# Patient Record
Sex: Male | Born: 1975 | Race: White | Hispanic: No | Marital: Single | Smoking: Former smoker
Health system: Southern US, Community
[De-identification: ages and names within clinical notes are randomized; demographics above are authoritative.]

## PROBLEM LIST (undated history)

## (undated) DIAGNOSIS — K219 Gastro-esophageal reflux disease without esophagitis: Secondary | ICD-10-CM

## (undated) DIAGNOSIS — F1090 Alcohol use, unspecified, uncomplicated: Secondary | ICD-10-CM

## (undated) DIAGNOSIS — Z7289 Other problems related to lifestyle: Secondary | ICD-10-CM

## (undated) DIAGNOSIS — B019 Varicella without complication: Secondary | ICD-10-CM

## (undated) DIAGNOSIS — F419 Anxiety disorder, unspecified: Secondary | ICD-10-CM

## (undated) DIAGNOSIS — Z789 Other specified health status: Secondary | ICD-10-CM

## (undated) DIAGNOSIS — E785 Hyperlipidemia, unspecified: Secondary | ICD-10-CM

## (undated) DIAGNOSIS — I10 Essential (primary) hypertension: Secondary | ICD-10-CM

## (undated) DIAGNOSIS — R011 Cardiac murmur, unspecified: Secondary | ICD-10-CM

## (undated) DIAGNOSIS — K5900 Constipation, unspecified: Secondary | ICD-10-CM

## (undated) HISTORY — DX: Other specified health status: Z78.9

## (undated) HISTORY — DX: Essential (primary) hypertension: I10

## (undated) HISTORY — DX: Constipation, unspecified: K59.00

## (undated) HISTORY — DX: Varicella without complication: B01.9

## (undated) HISTORY — DX: Alcohol use, unspecified, uncomplicated: F10.90

## (undated) HISTORY — DX: Hyperlipidemia, unspecified: E78.5

## (undated) HISTORY — DX: Other problems related to lifestyle: Z72.89

## (undated) HISTORY — PX: KNEE ARTHROCENTESIS: SUR44

## (undated) HISTORY — DX: Cardiac murmur, unspecified: R01.1

## (undated) HISTORY — DX: Anxiety disorder, unspecified: F41.9

## (undated) HISTORY — DX: Gastro-esophageal reflux disease without esophagitis: K21.9

---

## 2004-06-02 ENCOUNTER — Emergency Department (HOSPITAL_COMMUNITY): Admission: EM | Admit: 2004-06-02 | Discharge: 2004-06-02 | Payer: Self-pay | Admitting: Emergency Medicine

## 2004-07-08 ENCOUNTER — Observation Stay (HOSPITAL_COMMUNITY): Admission: AC | Admit: 2004-07-08 | Discharge: 2004-07-08 | Payer: Self-pay

## 2005-03-10 ENCOUNTER — Emergency Department (HOSPITAL_COMMUNITY): Admission: EM | Admit: 2005-03-10 | Discharge: 2005-03-10 | Payer: Self-pay | Admitting: Family Medicine

## 2005-03-12 ENCOUNTER — Encounter (INDEPENDENT_AMBULATORY_CARE_PROVIDER_SITE_OTHER): Payer: Self-pay | Admitting: Specialist

## 2005-03-12 ENCOUNTER — Ambulatory Visit (HOSPITAL_COMMUNITY): Admission: RE | Admit: 2005-03-12 | Discharge: 2005-03-12 | Payer: Self-pay | Admitting: *Deleted

## 2005-03-31 ENCOUNTER — Encounter: Admission: RE | Admit: 2005-03-31 | Discharge: 2005-03-31 | Payer: Self-pay | Admitting: *Deleted

## 2005-09-01 ENCOUNTER — Emergency Department (HOSPITAL_COMMUNITY): Admission: EM | Admit: 2005-09-01 | Discharge: 2005-09-01 | Payer: Self-pay | Admitting: Family Medicine

## 2005-12-03 IMAGING — CR DG SMALL BOWEL
1 series · 1 of 1 positions shown · non-contrast
Comparison: none

CLINICAL DATA: Diarrhea.  Some inflammation seen on colonoscopic exam. 
 SMALL BOWEL SERIES:
 The scout film shows no evidence of obstruction, mass or abnormal calcification.  The patient ingested barium and follow-up studies were made at approximately 20 minute intervals up to 1? hours and showed the small bowel to be normal in position, transit time and mucosal pattern.  Spot films of the terminal ileum show no evidence of thickening, mass or fistula formation.  The mucosa appeared normal as far as could be identified.   The appendix was not filled.

[view not recorded]
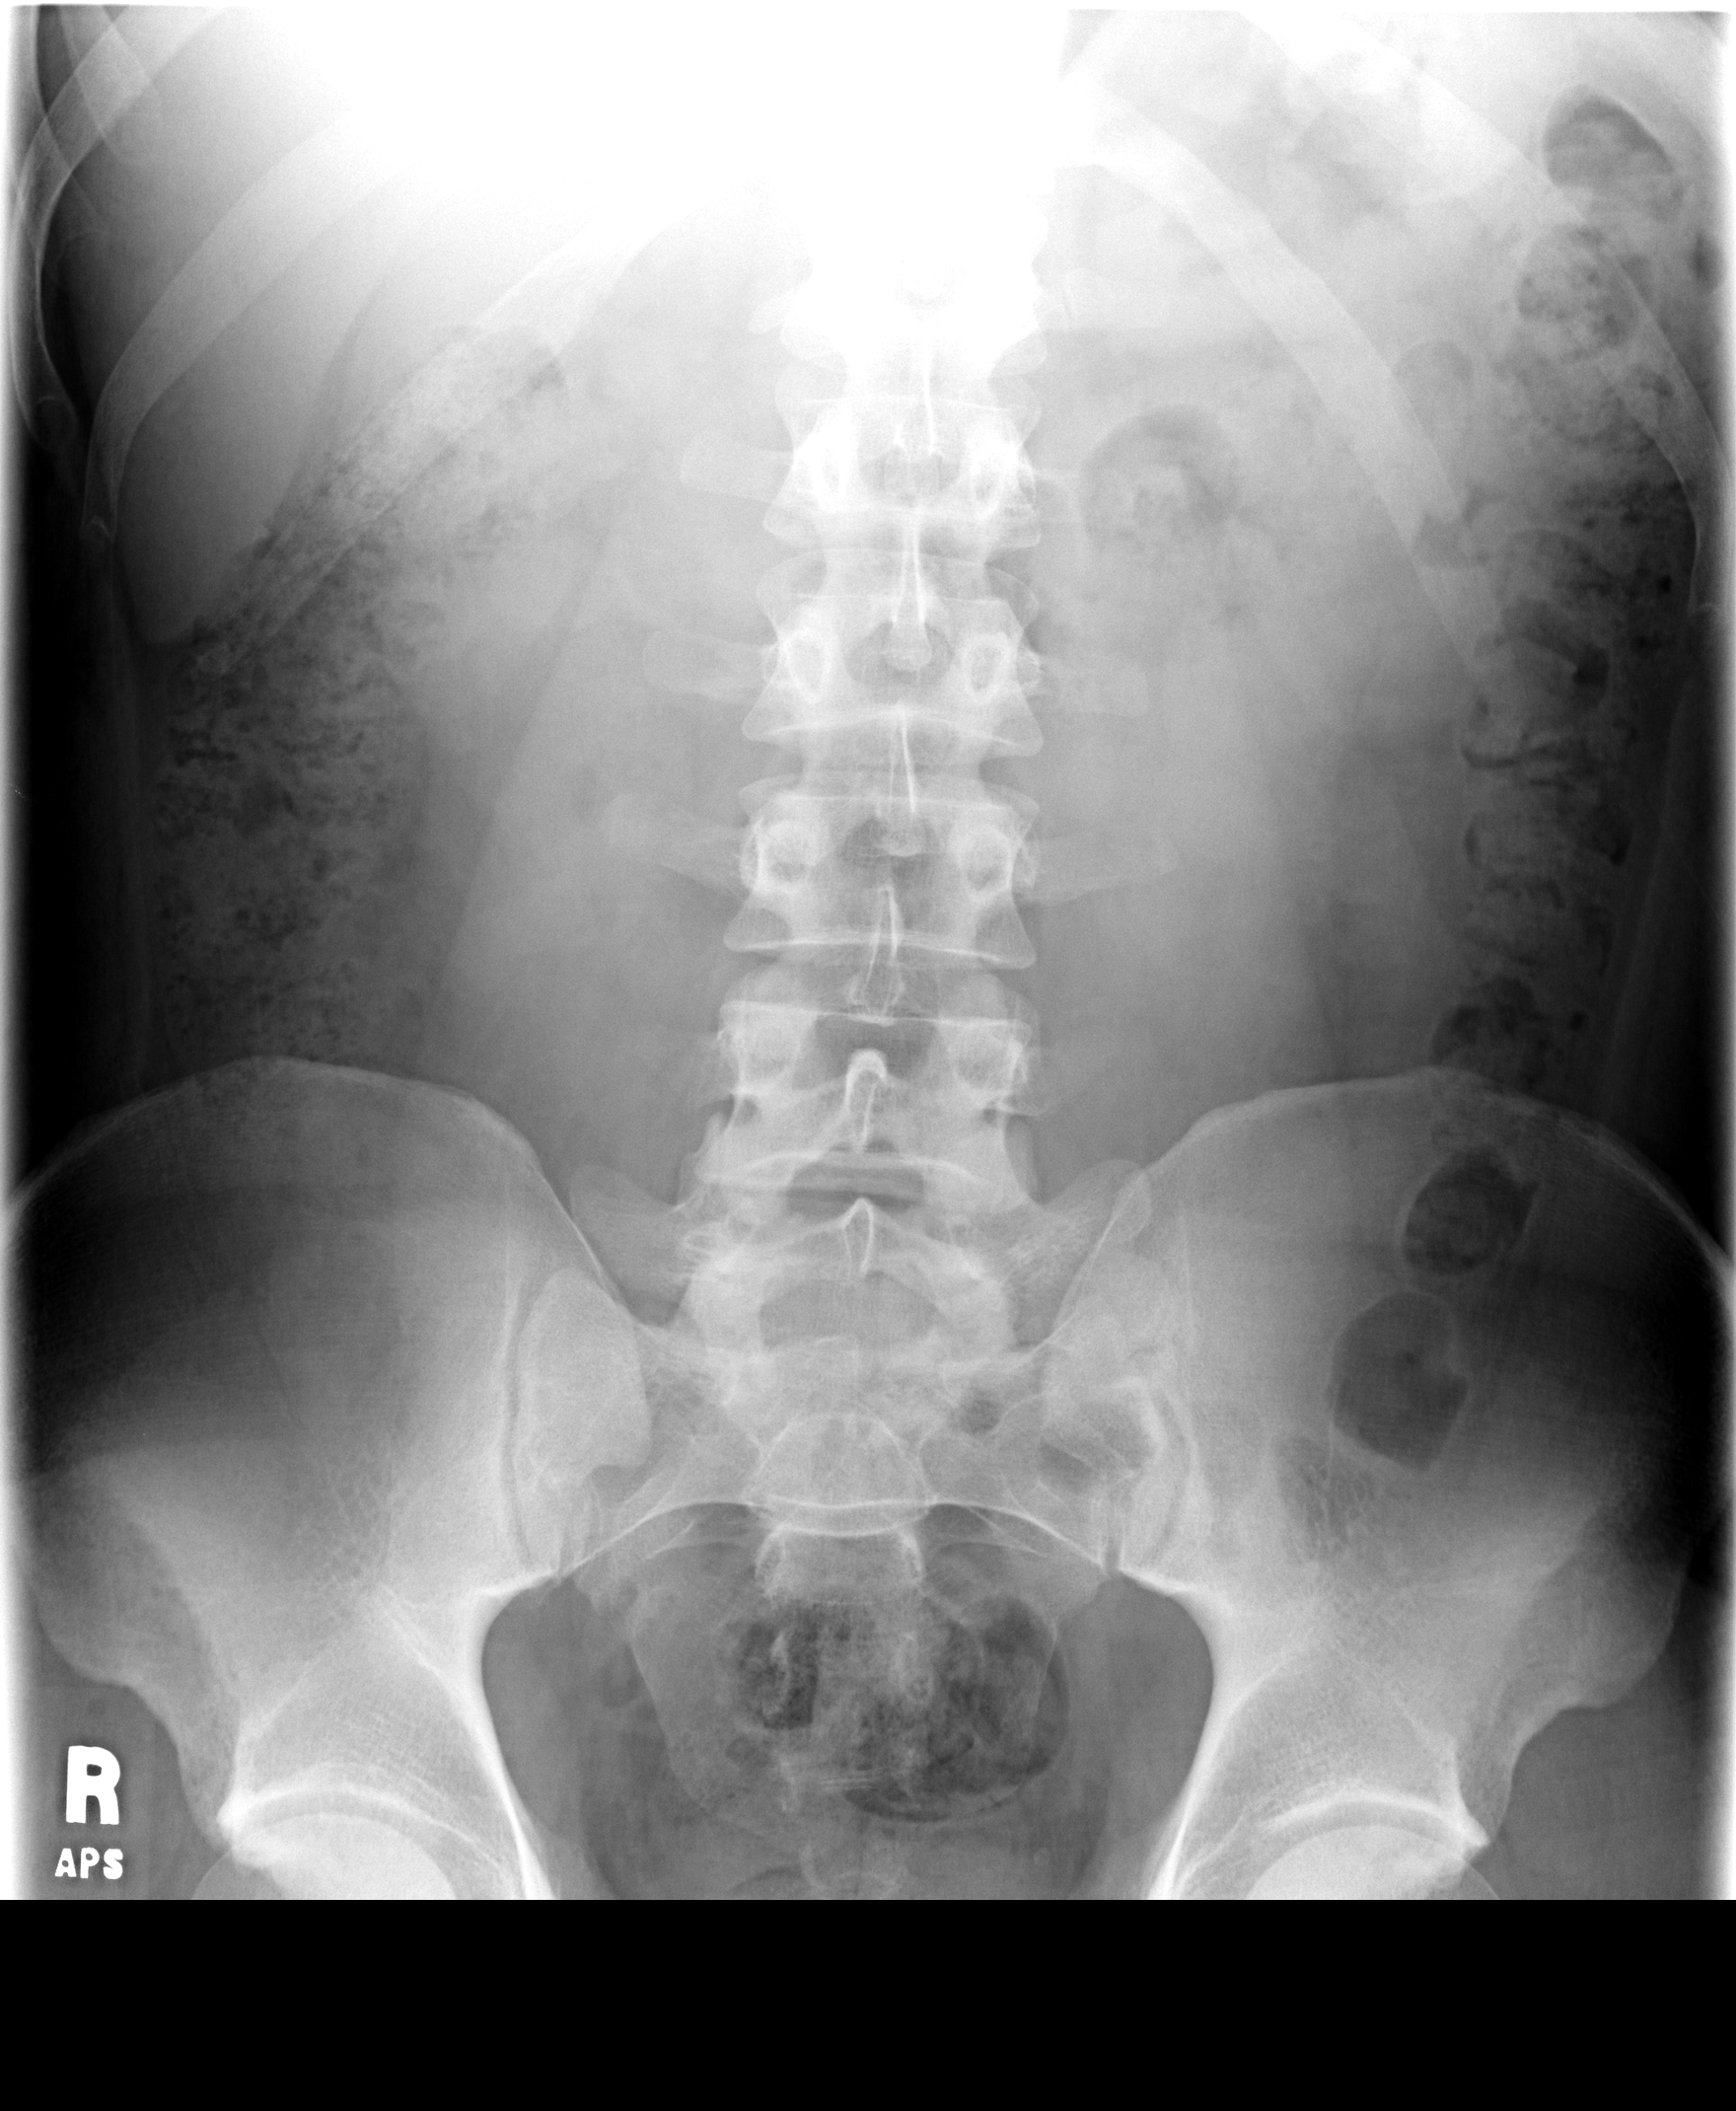

[1 of 1 positions shown; findings below may reference images not displayed]

IMPRESSION: Normal small bowel series with good visualization of the terminal ileum.  No reflux of the appendix was seen.

## 2006-11-16 ENCOUNTER — Emergency Department (HOSPITAL_COMMUNITY): Admission: EM | Admit: 2006-11-16 | Discharge: 2006-11-16 | Payer: Self-pay | Admitting: Family Medicine

## 2006-11-25 ENCOUNTER — Emergency Department (HOSPITAL_COMMUNITY): Admission: EM | Admit: 2006-11-25 | Discharge: 2006-11-25 | Payer: Self-pay | Admitting: Family Medicine

## 2007-09-18 ENCOUNTER — Emergency Department (HOSPITAL_COMMUNITY): Admission: EM | Admit: 2007-09-18 | Discharge: 2007-09-19 | Payer: Self-pay | Admitting: Emergency Medicine

## 2008-01-28 ENCOUNTER — Emergency Department (HOSPITAL_COMMUNITY): Admission: EM | Admit: 2008-01-28 | Discharge: 2008-01-28 | Payer: Self-pay | Admitting: Family Medicine

## 2008-03-13 ENCOUNTER — Emergency Department (HOSPITAL_COMMUNITY): Admission: EM | Admit: 2008-03-13 | Discharge: 2008-03-13 | Payer: Self-pay | Admitting: Emergency Medicine

## 2008-10-07 ENCOUNTER — Emergency Department (HOSPITAL_COMMUNITY): Admission: EM | Admit: 2008-10-07 | Discharge: 2008-10-07 | Payer: Self-pay | Admitting: Emergency Medicine

## 2011-09-03 LAB — INFLUENZA A AND B ANTIGEN (CONVERTED LAB): Inflenza A Ag: POSITIVE — AB

## 2011-09-07 LAB — POCT RAPID STREP A: Streptococcus, Group A Screen (Direct): NEGATIVE

## 2011-12-06 ENCOUNTER — Encounter: Payer: Self-pay | Admitting: Emergency Medicine

## 2011-12-06 ENCOUNTER — Emergency Department (HOSPITAL_COMMUNITY)
Admission: EM | Admit: 2011-12-06 | Discharge: 2011-12-06 | Disposition: A | Discharge status: Home / Self Care | Payer: BC Managed Care – PPO | Source: Home / Self Care | Attending: Family Medicine | Admitting: Family Medicine

## 2011-12-06 DIAGNOSIS — J310 Chronic rhinitis: Secondary | ICD-10-CM

## 2011-12-06 DIAGNOSIS — J40 Bronchitis, not specified as acute or chronic: Secondary | ICD-10-CM

## 2011-12-06 MED ORDER — HYDROCOD POLST-CHLORPHEN POLST 10-8 MG/5ML PO LQCR: 5.0000 mL | Freq: Two times a day (BID) | ORAL | Status: DC | PRN

## 2011-12-06 MED ORDER — FLUTICASONE PROPIONATE 50 MCG/ACT NA SUSP: 2.0000 | Freq: Every day | NASAL | Status: DC

## 2011-12-06 MED ORDER — ALBUTEROL SULFATE HFA 108 (90 BASE) MCG/ACT IN AERS: 2.0000 | INHALATION_SPRAY | RESPIRATORY_TRACT | Status: DC | PRN

## 2011-12-06 MED ORDER — AZITHROMYCIN 250 MG PO TABS: 250.0000 mg | ORAL_TABLET | Freq: Every day | ORAL | Status: AC

## 2011-12-06 NOTE — ED Notes (Signed)
Reports sinus congestion and runny nose for 6-8 weeks.  Reporting chest congestion, denies fever.  Sore chest and upper back

## 2011-12-06 NOTE — ED Provider Notes (Signed)
History     CSN: [id number]  Arrival date & time 12/06/11  1205   First MD Initiated Contact with Patient 12/06/11 1343      Chief Complaint  Patient presents with  . URI    (Consider location/radiation/quality/duration/timing/severity/associated sxs/prior treatment) HPI Comments: Craig Cabrera presents for evaluation of persistent nasal congestion and cough over the last 8 weeks. He has seen two providers prior, and given cough syrup, and he thinks an antibiotic. He denies fever. He does smoke 1 pack of cigarettes daily.   Patient is a 35 y.o. male presenting with URI. The history is provided by the patient.  URI The primary symptoms include cough and wheezing. Primary symptoms do not include fever. The current episode started more than 1 week ago. This is a new problem. The problem has not changed since onset. The cough began more than 1 week ago. The cough is new. The cough is non-productive.  Symptoms associated with the illness include congestion and rhinorrhea. The illness is not associated with chills.    History reviewed. No pertinent past medical history.  History reviewed. No pertinent past surgical history.  History reviewed. No pertinent family history.  History  Substance Use Topics  . Smoking status: Current Everyday Smoker  . Smokeless tobacco: Not on file  . Alcohol Use: Yes      Review of Systems  Constitutional: Negative for fever and chills.  HENT: Positive for congestion and rhinorrhea.   Eyes: Negative.   Respiratory: Positive for cough and wheezing.   Cardiovascular: Negative.   Gastrointestinal: Negative.   Genitourinary: Negative.   Musculoskeletal: Negative.   Neurological: Negative.     Allergies  Review of patient's allergies indicates no known allergies.  Home Medications   Current Outpatient Rx  Name Route Sig Dispense Refill  . ALBUTEROL SULFATE HFA 108 (90 BASE) MCG/ACT IN AERS Inhalation Inhale 2 puffs into the lungs every 4 (four)  hours as needed for wheezing or shortness of breath. 1 Inhaler 0  . AZITHROMYCIN 250 MG PO TABS Oral Take 1 tablet (250 mg total) by mouth daily. Take two tablets on first day, then one tablet each day for four days 6 tablet 0  . HYDROCOD POLST-CHLORPHEN POLST 10-8 MG/5ML PO LQCR Oral Take 5 mLs by mouth every 12 (twelve) hours as needed. 140 mL 0  . FLUTICASONE PROPIONATE 50 MCG/ACT NA SUSP Nasal Place 2 sprays into the nose daily. 16 g 2    BP 134/73  Pulse 83  Temp(Src) 98.2 F (36.8 C) (Oral)  Resp 16  SpO2 100%  Physical Exam  Nursing note and vitals reviewed. Constitutional: He is oriented to person, place, and time. He appears well-developed and well-nourished.  HENT:  Head: Normocephalic and atraumatic.  Eyes: EOM are normal.  Neck: Normal range of motion.  Pulmonary/Chest: Effort normal. He has wheezes in the right middle field, the right lower field, the left middle field and the left lower field. He has no rhonchi. He has no rales.  Musculoskeletal: Normal range of motion.  Neurological: He is alert and oriented to person, place, and time.  Skin: Skin is warm and dry.  Psychiatric: His behavior is normal.    ED Course  Procedures (including critical care time)  Labs Reviewed - No data to display No results found.   1. Bronchitis   2. Rhinitis       MDM  Treated with fluticasone, Tussionex, albuterol, z-pack        Richardo Priest, MD 12/06/11  1514 

## 2011-12-06 NOTE — ED Notes (Signed)
Patient does not have a pcp

## 2015-01-12 ENCOUNTER — Encounter (HOSPITAL_COMMUNITY): Payer: Self-pay | Admitting: Emergency Medicine

## 2015-01-12 ENCOUNTER — Emergency Department (HOSPITAL_COMMUNITY)
Admission: EM | Admit: 2015-01-12 | Discharge: 2015-01-12 | Disposition: A | Discharge status: Home / Self Care | Payer: 59 | Attending: Emergency Medicine | Admitting: Emergency Medicine

## 2015-01-12 DIAGNOSIS — Y9389 Activity, other specified: Secondary | ICD-10-CM | POA: Insufficient documentation

## 2015-01-12 DIAGNOSIS — Y998 Other external cause status: Secondary | ICD-10-CM | POA: Insufficient documentation

## 2015-01-12 DIAGNOSIS — S29002A Unspecified injury of muscle and tendon of back wall of thorax, initial encounter: Secondary | ICD-10-CM | POA: Diagnosis present

## 2015-01-12 DIAGNOSIS — Z720 Tobacco use: Secondary | ICD-10-CM | POA: Insufficient documentation

## 2015-01-12 DIAGNOSIS — Y9289 Other specified places as the place of occurrence of the external cause: Secondary | ICD-10-CM | POA: Diagnosis not present

## 2015-01-12 DIAGNOSIS — M62830 Muscle spasm of back: Secondary | ICD-10-CM

## 2015-01-12 DIAGNOSIS — W010XXA Fall on same level from slipping, tripping and stumbling without subsequent striking against object, initial encounter: Secondary | ICD-10-CM | POA: Diagnosis not present

## 2015-01-12 DIAGNOSIS — Z7951 Long term (current) use of inhaled steroids: Secondary | ICD-10-CM | POA: Insufficient documentation

## 2015-01-12 MED ORDER — METHOCARBAMOL 500 MG PO TABS: 1000.0000 mg | ORAL_TABLET | Freq: Three times a day (TID) | ORAL | Status: DC | PRN

## 2015-01-12 MED ORDER — IBUPROFEN 600 MG PO TABS: 600.0000 mg | ORAL_TABLET | Freq: Four times a day (QID) | ORAL | Status: DC | PRN

## 2015-01-12 MED ORDER — HYDROCODONE-ACETAMINOPHEN 5-325 MG PO TABS: 1.0000 | ORAL_TABLET | Freq: Four times a day (QID) | ORAL | Status: DC | PRN

## 2015-01-12 MED ORDER — KETOROLAC TROMETHAMINE 60 MG/2ML IM SOLN
60.0000 mg | Freq: Once | INTRAMUSCULAR | Status: AC
  Administered 2015-01-12: 60 mg via INTRAMUSCULAR
  Filled 2015-01-12: qty 2

## 2015-01-12 NOTE — ED Notes (Signed)
Patient states that he fell on Wednesday evening, landing on his back.  He is now having increased pain in mid back and left flank area.  Patient is CAOx4.

## 2015-01-12 NOTE — Discharge Instructions (Signed)
Muscle Cramps and Spasms Muscle cramps and spasms are when muscles tighten by themselves. They usually get better within minutes. Muscle cramps are painful. They are usually stronger and last longer than muscle spasms. Muscle spasms may or may not be painful. They can last a few seconds or much longer. HOME CARE  Drink enough fluid to keep your pee (urine) clear or pale yellow.  Massage, stretch, and relax the muscle.  Use a warm towel, heating pad, or warm shower water on tight muscles.  Place ice on the muscle if it is tender or in pain.  Put ice in a plastic bag.  Place a towel between your skin and the bag.  Leave the ice on for 15-20 minutes, 03-04 times a day.  Only take medicine as told by your doctor. GET HELP RIGHT AWAY IF:  Your cramps or spasms get worse, happen more often, or do not get better with time. MAKE SURE YOU:  Understand these instructions.  Will watch your condition.  Will get help right away if you are not doing well or get worse. Document Released: 11/11/2008 Document Revised: 03/26/2013 Document Reviewed: 11/15/2012 ExitCare Patient Information 2015 [employer]. This information is not intended to replace advice given to you by your health care provider. Make sure you discuss any questions you have with your health care provider.  

## 2015-01-12 NOTE — ED Provider Notes (Signed)
CSN: [id number]     Arrival date & time 01/12/15  0542 History   First MD Initiated Contact with Patient 01/12/15 [phone]     Chief Complaint  Patient presents with  . Fall  . Back Pain     (Consider location/radiation/quality/duration/timing/severity/associated sxs/prior Treatment) HPI Patient presents with left-sided back pain. States he's had pain since falling on his back on Wednesday. States he slipped and fell landing on his back. No head or neck injury. Denied any focal weakness or numbness. This evening had increased left-sided back pain. Was worse with movement and palpation. No urinary symptoms. States he took a hydrocodone at home with little relief. History reviewed. No pertinent past medical history. Past Surgical History  Procedure Laterality Date  . Knee arthrocentesis     History reviewed. No pertinent family history. History  Substance Use Topics  . Smoking status: Current Every Day Smoker  . Smokeless tobacco: Not on file  . Alcohol Use: 3.0 oz/week    5 Cans of beer per week    Review of Systems  Constitutional: Negative for fever and chills.  Respiratory: Negative for shortness of breath.   Cardiovascular: Negative for chest pain.  Musculoskeletal: Positive for myalgias and back pain. Negative for neck pain and neck stiffness.  Skin: Negative for rash and wound.  Neurological: Negative for dizziness, weakness, light-headedness, numbness and headaches.  All other systems reviewed and are negative.     Allergies  Review of patient's allergies indicates no known allergies.  Home Medications   Prior to Admission medications   Medication Sig Start Date End Date Taking? Authorizing Provider  albuterol (PROVENTIL HFA;VENTOLIN HFA) 108 (90 BASE) MCG/ACT inhaler Inhale 2 puffs into the lungs every 4 (four) hours as needed for wheezing or shortness of breath. 12/06/11 12/05/12  Delanna Notice, MD  chlorpheniramine-HYDROcodone [facility] PENNKINETIC ER) 10-8 MG/5ML  LQCR Take 5 mLs by mouth every 12 (twelve) hours as needed. Patient not taking: Reported on 01/12/2015 12/06/11   Delanna Notice, MD  fluticasone [facility]) 50 MCG/ACT nasal spray Place 2 sprays into the nose daily. 12/06/11 12/05/12  Delanna Notice, MD  HYDROcodone-acetaminophen (NORCO/VICODIN) 5-325 MG per tablet Take 1-2 tablets by mouth every 6 (six) hours as needed for moderate pain. 01/12/15   Loren Racer, MD  ibuprofen (ADVIL,MOTRIN) 600 MG tablet Take 1 tablet (600 mg total) by mouth every 6 (six) hours as needed. 01/12/15   Loren Racer, MD  methocarbamol (ROBAXIN) 500 MG tablet Take 2 tablets (1,000 mg total) by mouth every 8 (eight) hours as needed for muscle spasms. 01/12/15   Loren Racer, MD   BP 134/93 mmHg  Pulse 83  Temp(Src) 98 F (36.7 C) (Oral)  Resp 17  Ht  (1.753 m)  Wt 175 lb (79.379 kg)  BMI 25.83 kg/m2  SpO2 100% Physical Exam  Constitutional: He is oriented to person, place, and time. He appears well-developed and well-nourished. No distress.  HENT:  Head: Normocephalic and atraumatic.  Mouth/Throat: Oropharynx is clear and moist.  Eyes: EOM are normal. Pupils are equal, round, and reactive to light.  Neck: Normal range of motion. Neck supple.  No posterior midline cervical tenderness with palpation.  Cardiovascular: Normal rate and regular rhythm.   Pulmonary/Chest: Effort normal and breath sounds normal. No respiratory distress. He has no wheezes. He has no rales.  Abdominal: Soft. Bowel sounds are normal.  Musculoskeletal: Normal range of motion. He exhibits tenderness. He exhibits no edema.  Increased pain with movement. Tenderness with palpation along  the left-sided paraspinal muscles of the lumbar spine. Patient has no midline thoracic or lumbar tenderness. Bilateral negative straight leg raises. Distal pulses intact.  Neurological: He is alert and oriented to person, place, and time.  5/5 motor in all extremities. Sensation is intact. Ambulating  without difficulty.  Skin: Skin is warm and dry. No rash noted. No erythema.  Psychiatric: He has a normal mood and affect. His behavior is normal.  Nursing note and vitals reviewed.   ED Course  Procedures (including critical care time) Labs Review Labs Reviewed - No data to display  Imaging Review No results found.   EKG Interpretation None      MDM   Final diagnoses:  Lumbar paraspinal muscle spasm    Normal neurologic exam. Tenderness with palpation of the paraspinal muscles of the lumbar spine. We'll treat for muscle pain/spasm. Return precautions given.    Loren Raceravid Ayane Delancey, MD 01/12/15 [id number]

## 2016-02-05 ENCOUNTER — Ambulatory Visit (INDEPENDENT_AMBULATORY_CARE_PROVIDER_SITE_OTHER): Payer: 59 | Admitting: Family Medicine

## 2016-02-05 ENCOUNTER — Encounter: Payer: Self-pay | Admitting: Family Medicine

## 2016-02-05 VITALS — BP 138/92 | HR 79 | Temp 98.5°F | Ht 69.0 in | Wt 183.0 lb

## 2016-02-05 DIAGNOSIS — Z789 Other specified health status: Secondary | ICD-10-CM | POA: Diagnosis not present

## 2016-02-05 DIAGNOSIS — K219 Gastro-esophageal reflux disease without esophagitis: Secondary | ICD-10-CM | POA: Diagnosis not present

## 2016-02-05 DIAGNOSIS — I10 Essential (primary) hypertension: Secondary | ICD-10-CM | POA: Insufficient documentation

## 2016-02-05 DIAGNOSIS — K5900 Constipation, unspecified: Secondary | ICD-10-CM | POA: Diagnosis not present

## 2016-02-05 DIAGNOSIS — IMO0001 Reserved for inherently not codable concepts without codable children: Secondary | ICD-10-CM

## 2016-02-05 DIAGNOSIS — R030 Elevated blood-pressure reading, without diagnosis of hypertension: Secondary | ICD-10-CM | POA: Diagnosis not present

## 2016-02-05 DIAGNOSIS — Z7289 Other problems related to lifestyle: Secondary | ICD-10-CM | POA: Insufficient documentation

## 2016-02-05 NOTE — Assessment & Plan Note (Signed)
S: suspect hypertension. Patient wonders if due to anxiety of new office BP Readings from Last 3 Encounters:  02/05/16 138/92  01/12/15 134/93  12/06/11 134/73  A/P: home monitoring advised. Cut alcohol to 14 a week, quit smoking ocmpletely. Recently exercising 3x a week for 3 weeks- continue. Trim extra 8 lbs off from a year ago. DASH diet. Likely need to push lifestyle over anything else at follow up.

## 2016-02-05 NOTE — Patient Instructions (Addendum)
Advise you to limit the alcohol to 14 a week maximum.  Advise you to completely quit smoking (eventually coming off vaping) Great job starting with exercise Also see Dash eating plan below  Alcohol being lower, quitting smoking, exercising, and dash eating plan can lower blood pressure and hopefully help [place] avoid medication. Consider monitoring pressures at home and bringing cuff and log of pressures to next visit  Cutting down on alcohol may reduce stomach irritation and improve that sensation in the morning. The other thing I would suggest is trying the dose with dinner instead of in the morning. If you continue to have issues, consider CT scan at your physical  Sign release of information at the check out desk for records from [place] specifically immunizations as well as [facility] for your EGD report.   See if you can find the record of when you had the Tetanus shot  4-6 month physical  DASH Eating Plan DASH stands for "Dietary Approaches to Stop Hypertension." The DASH eating plan is a healthy eating plan that has been shown to reduce high blood pressure (hypertension). Additional health benefits may include reducing the risk of type 2 diabetes mellitus, heart disease, and stroke. The DASH eating plan may also help with weight loss. WHAT DO I NEED TO KNOW ABOUT THE DASH EATING PLAN? For the DASH eating plan, you will follow these general guidelines:  Choose foods with a percent daily value for sodium of less than 5% (as listed on the food label).  Use salt-free seasonings or herbs instead of table salt or sea salt.  Check with your health care provider or pharmacist before using salt substitutes.  Eat lower-sodium products, often labeled as "lower sodium" or "no salt added."  Eat fresh foods.  Eat more vegetables, fruits, and low-fat dairy products.  Choose whole grains. Look for the word "whole" as the first word in the ingredient list.  Choose fish and skinless  chicken or [place] more often than red meat. Limit fish, poultry, and meat to 6 oz (170 g) each day.  Limit sweets, desserts, sugars, and sugary drinks.  Choose heart-healthy fats.  Limit cheese to 1 oz (28 g) per day.  Eat more home-cooked food and less restaurant, buffet, and fast food.  Limit fried foods.  Cook foods using methods other than frying.  Limit canned vegetables. If you do use them, rinse them well to decrease the sodium.  When eating at a restaurant, ask that your food be prepared with less salt, or no salt if possible. WHAT FOODS CAN I EAT? Seek help from a dietitian for individual calorie needs. Grains Whole grain or whole wheat bread. Brown rice. Whole grain or whole wheat pasta. Quinoa, bulgur, and whole grain cereals. Low-sodium cereals. Corn or whole wheat flour tortillas. Whole grain cornbread. Whole grain crackers. Low-sodium crackers. Vegetables Fresh or frozen vegetables (raw, steamed, roasted, or grilled). Low-sodium or reduced-sodium tomato and vegetable juices. Low-sodium or reduced-sodium tomato sauce and paste. Low-sodium or reduced-sodium canned vegetables.  Fruits All fresh, canned (in natural juice), or frozen fruits. Meat and Other Protein Products Ground beef (85% or leaner), grass-fed beef, or beef trimmed of fat. Skinless chicken or [place]. Ground chicken or [place]. Pork trimmed of fat. All fish and seafood. Eggs. Dried beans, peas, or lentils. Unsalted nuts and seeds. Unsalted canned beans. Dairy Low-fat dairy products, such as skim or 1% milk, 2% or reduced-fat cheeses, low-fat ricotta or cottage cheese, or plain low-fat yogurt. Low-sodium or  reduced-sodium cheeses. Fats and Oils Tub margarines without trans fats. Light or reduced-fat mayonnaise and salad dressings (reduced sodium). Avocado. Safflower, olive, or canola oils. Natural peanut or almond butter. Other Unsalted popcorn and pretzels. The items listed above may not be a complete list of  recommended foods or beverages. Contact your dietitian for more options. WHAT FOODS ARE NOT RECOMMENDED? Grains White bread. White pasta. White rice. Refined cornbread. Bagels and croissants. Crackers that contain trans fat. Vegetables Creamed or fried vegetables. Vegetables in a cheese sauce. Regular canned vegetables. Regular canned tomato sauce and paste. Regular tomato and vegetable juices. Fruits Dried fruits. Canned fruit in light or heavy syrup. Fruit juice. Meat and Other Protein Products Fatty cuts of meat. Ribs, chicken wings, bacon, sausage, bologna, salami, chitterlings, fatback, hot dogs, bratwurst, and packaged luncheon meats. Salted nuts and seeds. Canned beans with salt. Dairy Whole or 2% milk, cream, half-and-half, and cream cheese. Whole-fat or sweetened yogurt. Full-fat cheeses or blue cheese. Nondairy creamers and whipped toppings. Processed cheese, cheese spreads, or cheese curds. Condiments Onion and garlic salt, seasoned salt, table salt, and sea salt. Canned and packaged gravies. Worcestershire sauce. Tartar sauce. Barbecue sauce. Teriyaki sauce. Soy sauce, including reduced sodium. Steak sauce. Fish sauce. Oyster sauce. Cocktail sauce. Horseradish. Ketchup and mustard. Meat flavorings and tenderizers. Bouillon cubes. Hot sauce. Tabasco sauce. Marinades. Taco seasonings. Relishes. Fats and Oils Butter, stick margarine, lard, shortening, ghee, and bacon fat. Coconut, palm kernel, or palm oils. Regular salad dressings. Other Pickles and olives. Salted popcorn and pretzels. The items listed above may not be a complete list of foods and beverages to avoid. Contact your dietitian for more information. WHERE CAN I FIND MORE INFORMATION? National Heart, Lung, and Blood Institute: travelstabloid.com   This information is not intended to replace advice given to you by your health care provider. Make sure you discuss any questions you have with  your health care provider.   Document Released: 11/18/2011 Document Revised: 12/20/2014 Document Reviewed: 10/03/2013 Elsevier Interactive Patient Education [employer].

## 2016-02-05 NOTE — Progress Notes (Signed)
Craig Conch, MD Phone: [phone]  Subjective:  Patient presents today to establish care. Chief complaint-noted.   See problem oriented charting  The following were reviewed and entered/updated in epic: Past Medical History  Diagnosis Date  . Chicken pox   . GERD (gastroesophageal reflux disease)     nexium OTC. Has seen [facility] for endoscopy- burning sensation in epigastric area in AM- only "alcohol irritation"  . Alcohol use (HCC)     40 per week (6 beers about 5x a week- goes out)  . Constipation     colonoscopy around 2006 was normal   Patient Active Problem List   Diagnosis Date Noted  . Elevated blood pressure 02/05/2016    Priority: Medium  . GERD (gastroesophageal reflux disease)     Priority: Medium  . Alcohol use (HCC)     Priority: Medium  . Constipation     Priority: Low   Past Surgical History  Procedure Laterality Date  . Knee arthrocentesis      Family History  Problem Relation Age of Onset  . Healthy Mother     father  . Lung cancer      3/4 grandparents smokers    Medications- reviewed and updated Current Outpatient Prescriptions  Medication Sig Dispense Refill  . esomeprazole (NEXIUM) 20 MG capsule Take 20 mg by mouth daily at 12 noon.     No current facility-administered medications for this visit.    Allergies-reviewed and updated No Known Allergies  Social History   Social History  . Marital Status: Single    Spouse Name: N/A  . Number of Children: N/A  . Years of Education: N/A   Social History Main Topics  . Smoking status: Current Every Day Smoker  . Smokeless tobacco: None  . Alcohol Use: 24.0 oz/week    40 Cans of beer per week  . Drug Use: No  . Sexual Activity: Not Asked   Other Topics Concern  . None   Social History Narrative   Family: Single, engaged. Together 7 years in 2017. No date for marriage yet. 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  HENT: Negative for hearing loss.   Eyes: Negative for blurred vision and double vision.  Respiratory: Negative for cough and hemoptysis.   Cardiovascular: Negative for chest pain and palpitations.  Gastrointestinal: Positive for heartburn and nausea. Negative for vomiting.  Genitourinary: Negative for dysuria and urgency.  Musculoskeletal: Negative for myalgias and neck pain.  Skin: Negative for itching and rash.  Neurological: Negative for dizziness, tingling and headaches.  Endo/Heme/Allergies: Negative for polydipsia. Does not bruise/bleed easily.  Psychiatric/Behavioral: Negative for hallucinations and memory loss.   Objective: BP 138/92 mmHg  Pulse 79  Temp(Src) 98.5 F (36.9 C)  Ht  (1.753 m)  Wt 183 lb (83.008 kg)  BMI 27.01 kg/m2 Gen: NAD, resting comfortably HEENT: Mucous membranes are moist. Oropharynx normal. TM normal. Eyes: sclera and lids normal, PERRLA Neck: no thyromegaly, no cervical lymphadenopathy CV: RRR no murmurs rubs or gallops Lungs: CTAB no crackles, wheeze, rhonchi Abdomen: soft/nontender/nondistended/normal bowel sounds. No rebound or guarding. No hepatosplenomegaly. Perhaps mild asymmetry on right side of lower abdomen compared to left.  Ext: no edema, 2+ PT pulses Skin: warm, dry Neuro: 5/5 strength in upper and lower extremities, normal gait, normal reflexes  Assessment/Plan:  Elevated blood pressure S: suspect hypertension. Patient wonders if due to anxiety of new office BP Readings from Last 3 Encounters:  02/05/16 138/92  01/12/15 134/93  12/06/11 134/73  A/P: home monitoring advised. Cut alcohol to 14 a week, quit smoking ocmpletely. Recently exercising 3x a week for 3 weeks- continue. Trim extra 8 lbs off from a year ago. DASH diet. Likely need to push lifestyle over anything else at follow up.    GERD (gastroesophageal reflux  disease) S: nexium OTC. Has seen [facility] for endoscopy- burning sensation in epigastric area in AM- only "alcohol irritation". Feels like he wants to gag or throw up. Gets better with nexium. GERD issues for 3 years- gagging feeling in morning for about a year.  A/P: get records for endoscopy. Encouraging that he has had this already with Dr. Bosie Clos of [facility]. 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 Trial nexium with dinner instead of AM  Alcohol use (HCC) S: 40 per week (6 beers about 5x a week- goes out). CAGE negative A/P: advised drastic cut back by at least 50%. Discussed if trouble with this would consider AA.    Constipation S: colonoscopy around 2006 was normal reportedly. Done due to constipation. Has had some recurrent issues since starting nexium A/P: push fluids and fiber as well as exercise. Not drinking enough h20 contributing.    4-6 month CPE. Consider CT abdomen/pelvis at follow up if continued reflux symptoms and alcohol drastically cut down. Return precautions advised.   Meds ordered this encounter  Medications  . esomeprazole (NEXIUM) 20 MG capsule    Sig: Take 20 mg by mouth daily at 12 noon.

## 2016-02-05 NOTE — Assessment & Plan Note (Signed)
S: 40 per week (6 beers about 5x a week- goes out). CAGE negative A/P: advised drastic cut back by at least 50%. Discussed if trouble with this would consider AA.

## 2016-02-05 NOTE — Assessment & Plan Note (Signed)
S: colonoscopy around 2006 was normal reportedly. Done due to constipation. Has had some recurrent issues since starting nexium A/P: push fluids and fiber as well as exercise. Not drinking enough h20 contributing.

## 2016-02-05 NOTE — Assessment & Plan Note (Signed)
S: nexium OTC. Has seen [facility] for endoscopy- burning sensation in epigastric area in AM- only "alcohol irritation". Feels like he wants to gag or throw up. Gets better with nexium. GERD issues for 3 years- gagging feeling in morning for about a year.  A/P: get records for endoscopy. Encouraging that he has had this already with Dr. Bosie Clos of [facility]. 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 Trial nexium with dinner instead of AM

## 2016-05-25 ENCOUNTER — Other Ambulatory Visit (INDEPENDENT_AMBULATORY_CARE_PROVIDER_SITE_OTHER): Payer: 59

## 2016-05-25 DIAGNOSIS — R7989 Other specified abnormal findings of blood chemistry: Secondary | ICD-10-CM

## 2016-05-25 DIAGNOSIS — Z0000 Encounter for general adult medical examination without abnormal findings: Secondary | ICD-10-CM | POA: Diagnosis not present

## 2016-05-25 LAB — CBC WITH DIFFERENTIAL/PLATELET
BASOS ABS: 0 10*3/uL (ref 0.0–0.1)
Basophils Relative: 0.5 % (ref 0.0–3.0)
EOS ABS: 0.3 10*3/uL (ref 0.0–0.7)
Eosinophils Relative: 4.2 % (ref 0.0–5.0)
HEMATOCRIT: 44.6 % (ref 39.0–52.0)
HEMOGLOBIN: 15.3 g/dL (ref 13.0–17.0)
LYMPHS ABS: 2.3 10*3/uL (ref 0.7–4.0)
Lymphocytes Relative: 31.3 % (ref 12.0–46.0)
MCHC: 34.3 g/dL (ref 30.0–36.0)
MCV: 89.1 fl (ref 78.0–100.0)
MONO ABS: 0.6 10*3/uL (ref 0.1–1.0)
Monocytes Relative: 7.9 % (ref 3.0–12.0)
NEUTROS ABS: 4.2 10*3/uL (ref 1.4–7.7)
Neutrophils Relative %: 56.1 % (ref 43.0–77.0)
PLATELETS: 224 10*3/uL (ref 150.0–400.0)
RBC: 5.01 Mil/uL (ref 4.22–5.81)
RDW: 13.2 % (ref 11.5–15.5)
WBC: 7.4 10*3/uL (ref 4.0–10.5)

## 2016-05-25 LAB — POC URINALSYSI DIPSTICK (AUTOMATED)
BILIRUBIN UA: NEGATIVE
GLUCOSE UA: NEGATIVE
KETONES UA: NEGATIVE
Leukocytes, UA: NEGATIVE
NITRITE UA: NEGATIVE
PH UA: 6
RBC UA: NEGATIVE
SPEC GRAV UA: 1.025
Urobilinogen, UA: 0.2

## 2016-05-25 LAB — LDL CHOLESTEROL, DIRECT: Direct LDL: 147 mg/dL

## 2016-05-25 LAB — HEPATIC FUNCTION PANEL
ALK PHOS: 72 U/L (ref 39–117)
ALT: 25 U/L (ref 0–53)
AST: 16 U/L (ref 0–37)
Albumin: 4.4 g/dL (ref 3.5–5.2)
BILIRUBIN DIRECT: 0.1 mg/dL (ref 0.0–0.3)
BILIRUBIN TOTAL: 0.6 mg/dL (ref 0.2–1.2)
TOTAL PROTEIN: 7.2 g/dL (ref 6.0–8.3)

## 2016-05-25 LAB — LIPID PANEL
CHOL/HDL RATIO: 7
Cholesterol: 224 mg/dL — ABNORMAL HIGH (ref 0–200)
HDL: 32.4 mg/dL — AB (ref >39.00)
NONHDL: 191.31
TRIGLYCERIDES: 325 mg/dL — AB (ref 0.0–149.0)
VLDL: 65 mg/dL — ABNORMAL HIGH (ref 0.0–40.0)

## 2016-05-25 LAB — BASIC METABOLIC PANEL
BUN: 15 mg/dL (ref 6–23)
CALCIUM: 9.6 mg/dL (ref 8.4–10.5)
CO2: 28 meq/L (ref 19–32)
CREATININE: 1.26 mg/dL (ref 0.40–1.50)
Chloride: 102 mEq/L (ref 96–112)
GFR: 67.07 mL/min (ref >60.00)
GLUCOSE: 105 mg/dL — AB (ref 70–99)
Potassium: 3.6 mEq/L (ref 3.5–5.1)
Sodium: 139 mEq/L (ref 135–145)

## 2016-05-25 LAB — TSH: TSH: 1.97 u[IU]/mL (ref 0.35–4.50)

## 2016-06-01 ENCOUNTER — Ambulatory Visit (INDEPENDENT_AMBULATORY_CARE_PROVIDER_SITE_OTHER): Payer: 59 | Admitting: Family Medicine

## 2016-06-01 ENCOUNTER — Encounter: Payer: Self-pay | Admitting: Family Medicine

## 2016-06-01 VITALS — BP 122/92 | HR 97 | Temp 98.1°F | Ht 69.0 in | Wt 183.0 lb

## 2016-06-01 DIAGNOSIS — Z0000 Encounter for general adult medical examination without abnormal findings: Secondary | ICD-10-CM | POA: Diagnosis not present

## 2016-06-01 DIAGNOSIS — Z720 Tobacco use: Secondary | ICD-10-CM | POA: Insufficient documentation

## 2016-06-01 DIAGNOSIS — Z23 Encounter for immunization: Secondary | ICD-10-CM

## 2016-06-01 DIAGNOSIS — R739 Hyperglycemia, unspecified: Secondary | ICD-10-CM | POA: Insufficient documentation

## 2016-06-01 NOTE — Assessment & Plan Note (Signed)
S: poorly controlled.  Last visit advised cut alcohol from 40 to 14 a week and to quit smoking completely. Had started exercising- wanted to trim extra 8 lbs off at least- advised Dash diet. States didn't exercise and started smoking again, didn't eat better.  BP Readings from Last 3 Encounters:  06/01/16 122/92  02/05/16 138/92  01/12/15 134/93  A/P:Continue without meds. Patient wants 1 more chance to quit smoking, eat better, exercise more- if not at goal by follow up we agreed to start medication

## 2016-06-01 NOTE — Progress Notes (Signed)
Phone: [phone]  Subjective:  Patient presents today for their annual physical. Chief complaint-noted.   See problem oriented charting- ROS- full  review of systems was completed and negative except for occasional epigastric burning when alcohol use increased, constipation  The following were reviewed and entered/updated in epic: Past Medical History  Diagnosis Date  . Chicken pox   . GERD (gastroesophageal reflux disease)     nexium OTC. Has seen [facility] for endoscopy- burning sensation in epigastric area in AM- only "alcohol irritation"  . Alcohol use (HCC)     40 per week (6 beers about 5x a week- goes out)  . Constipation     colonoscopy around 2006 was normal   Patient Active Problem List   Diagnosis Date Noted  . Hyperglycemia 06/01/2016    Priority: Medium  . Hypertension 02/05/2016    Priority: Medium  . GERD (gastroesophageal reflux disease)     Priority: Medium  . Alcohol use (HCC)     Priority: Medium  . Constipation     Priority: Low   Past Surgical History  Procedure Laterality Date  . Knee arthrocentesis      Family History  Problem Relation Age of Onset  . Healthy Mother     father  . Lung cancer      3/4 grandparents smokers    Medications- reviewed and updated Current Outpatient Prescriptions  Medication Sig Dispense Refill  . esomeprazole (NEXIUM) 20 MG capsule Take 40 mg by mouth daily at 12 noon.      No current facility-administered medications for this visit.    Allergies-reviewed and updated No Known Allergies  Social History   Social History  . Marital Status: Single    Spouse Name: N/A  . Number of Children: N/A  . Years of Education: N/A   Social History Main Topics  . Smoking status: Current Every Day Smoker -- 1.00 packs/day    Types: Cigarettes  . Smokeless tobacco: Never Used  . Alcohol Use: 24.0 oz/week    40 Cans of beer per week  . Drug Use: No  . Sexual Activity: Not Asked   Other Topics Concern  . None    Social History Narrative   Family: Single, engaged. Together 7 years in 2017. No date for marriage yet. 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 Oropharynx normal Neck: no thyromegaly CV: RRR no murmurs rubs or gallops Lungs: CTAB no crackles, wheeze, rhonchi Abdomen: soft/nontender/nondistended/normal bowel sounds. No rebound or guarding.  Ext: no edema Skin: warm, dry Neuro: grossly normal, moves all extremities, PERRLA  Assessment/Plan:  40 y.o. male presenting for annual physical.  Health Maintenance counseling: 1. Anticipatory guidance: Patient counseled regarding regular dental exams, eye exams (not going), wearing seatbelts.  2. Risk factor reduction:  Advised patient of need for regular exercise and diet rich and fruits and vegetables to reduce risk of heart attack and stroke.  Wt Readings from Last 3 Encounters:  06/01/16 183 lb (83.008 kg)  02/05/16 183 lb (83.008 kg)  01/12/15 175 lb (79.379 kg)  3. Immunizations/screenings/ancillary studies- advised Tdap  today. Consider HIV screen next bloodwork  4. Prostate cancer screening-  no family history, start age 40   5. Colon cancer screening - no family history, start age 40. Had early one in 2006 due to constipation which was normal. Wants to go back to GI to discuss repeat.  6. Skin cancer screening- Dr. Margo AyeHall dermatology yearly. No skin cancer history  Status of chronic or acute concerns   Hypertension S: poorly controlled.  Last visit advised cut alcohol from 40 to 14 a week and to quit smoking completely. Had started exercising- wanted to trim extra 8 lbs off at least- advised Dash diet. States didn't exercise and started smoking again, didn't eat  better.  BP Readings from Last 3 Encounters:  06/01/16 122/92  02/05/16 138/92  01/12/15 134/93  A/P:Continue without meds. Patient wants 1 more chance to quit smoking, eat better, exercise more- if not at goal by follow up we agreed to start medication  GERD (gastroesophageal reflux disease) S: reasonable control on nexium 20mg  OTC worse with alcohol- sometimes felt like wanted to gag or throw up- better on the nexium. [facility] Bos[facility] Clos[facility]er. ? Alcoholic gastritis. Move nexium to before dinner instead of in AM. Much less issueswith drinking less.  A/P: doing better with alcohol reduction down to 9-16 per week, wonder if had some alcohol related gastritis  Alcohol use (HCC) S: alchol use 40 per week (6 beers 5 days a week) previously now down to 3-4x a week with about 3-4 beers. Cage negative last visit A/P: down to 9-16 a week, advised maintain at this level or lower- with history abstinence would be best  Constipation S: advised trial of increased fluids/fiber- low on h20 prior visits. Some straining and nothing at times but overall slightly better-going daily.  A/P: wants to discuss repeat colonoscopy with Dr. Bosie ClosSchooler GI- advised him to schedule visit. Has never had polyps and no change in stool pattern so not sure this is necessary    Return in about 3 months (around 09/01/2016) for follow up- to check in on blood pressure.  Return precautions advised.   Tana ConchStephen Hunter, MD

## 2016-06-01 NOTE — Assessment & Plan Note (Signed)
S: advised trial of increased fluids/fiber- low on h20 prior visits. Some straining and nothing at times but overall slightly better-going daily.  A/P: wants to discuss repeat colonoscopy with Dr. Bosie ClosSchooler GI- advised him to schedule visit. Has never had polyps and no change in stool pattern so not sure this is necessary

## 2016-06-01 NOTE — Assessment & Plan Note (Signed)
S: alchol use 40 per week (6 beers 5 days a week) previously now down to 3-4x a week with about 3-4 beers. Cage negative last visit A/P: down to 9-16 a week, advised maintain at this level or lower- with history abstinence would be best

## 2016-06-01 NOTE — Progress Notes (Signed)
Pre visit review using our clinic review tool, if applicable. No additional management support is needed unless otherwise documented below in the visit note. 

## 2016-06-01 NOTE — Assessment & Plan Note (Signed)
S: reasonable control on nexium 20mg  OTC worse with alcohol- sometimes felt like wanted to gag or throw up- better on the nexium. [facility] Bos[facility] ClosSchooler. ? Alcoholic gastritis. Move nexium to before dinner instead of in AM. Much less issueswith drinking less.  A/P: doing better with alcohol reduction down to 9-16 per week, wonder if had some alcohol related gastritis

## 2016-06-01 NOTE — Patient Instructions (Signed)
Blood pressure remains high- you wanted to start by improving diet (see dash diet), then pick up exercising. Once those are in place- plan to quit smoking. If blood pressure still elevated at 3 month follow up- we will need to start medicatoin  Call Dr. Bosie ClosSchooler to discuss repeat colonoscopy- have them send us records from EGD and colonoscopy when you se ethem  The less alcohol the better with your history- would try to make 14 a week the maximum  DASH Eating Plan DASH stands for "Dietary Approaches to Stop Hypertension." The DASH eating plan is a healthy eating plan that has been shown to reduce high blood pressure (hypertension). Additional health benefits may include reducing the risk of type 2 diabetes mellitus, heart disease, and stroke. The DASH eating plan may also help with weight loss. WHAT DO I NEED TO KNOW ABOUT THE DASH EATING PLAN? For the DASH eating plan, you will follow these general guidelines:  Choose foods with a percent daily value for sodium of less than 5% (as listed on the food label).  Use salt-free seasonings or herbs instead of table salt or sea salt.  Check with your health care provider or pharmacist before using salt substitutes.  Eat lower-sodium products, often labeled as "lower sodium" or "no salt added."  Eat fresh foods.  Eat more vegetables, fruits, and low-fat dairy products.  Choose whole grains. Look for the word "whole" as the first word in the ingredient list.  Choose fish and skinless chicken or [place]key more often than red meat. Limit fish, poultry, and meat to 6 oz (170 g) each day.  Limit sweets, desserts, sugars, and sugary drinks.  Choose heart-healthy fats.  Limit cheese to 1 oz (28 g) per day.  Eat more home-cooked food and less restaurant, buffet, and fast food.  Limit fried foods.  Cook foods using methods other than frying.  Limit canned vegetables. If you do use them, rinse them well to decrease the sodium.  When eating at a  restaurant, ask that your food be prepared with less salt, or no salt if possible. WHAT FOODS CAN I EAT? Seek help from a dietitian for individual calorie needs. Grains Whole grain or whole wheat bread. Brown rice. Whole grain or whole wheat pasta. Quinoa, bulgur, and whole grain cereals. Low-sodium cereals. Corn or whole wheat flour tortillas. Whole grain cornbread. Whole grain crackers. Low-sodium crackers. Vegetables Fresh or frozen vegetables (raw, steamed, roasted, or grilled). Low-sodium or reduced-sodium tomato and vegetable juices. Low-sodium or reduced-sodium tomato sauce and paste. Low-sodium or reduced-sodium canned vegetables.  Fruits All fresh, canned (in natural juice), or frozen fruits. Meat and Other Protein Products Ground beef (85% or leaner), grass-fed beef, or beef trimmed of fat. Skinless chicken or [place]key. Ground chicken or [place]key. Pork trimmed of fat. All fish and seafood. Eggs. Dried beans, peas, or lentils. Unsalted nuts and seeds. Unsalted canned beans. Dairy Low-fat dairy products, such as skim or 1% milk, 2% or reduced-fat cheeses, low-fat ricotta or cottage cheese, or plain low-fat yogurt. Low-sodium or reduced-sodium cheeses. Fats and Oils Tub margarines without trans fats. Light or reduced-fat mayonnaise and salad dressings (reduced sodium). Avocado. Safflower, olive, or canola oils. Natural peanut or almond butter. Other Unsalted popcorn and pretzels. The items listed above may not be a complete list of recommended foods or beverages. Contact your dietitian for more options. WHAT FOODS ARE NOT RECOMMENDED? Grains White bread. White pasta. White rice. Refined cornbread. Bagels and croissants. Crackers that contain trans fat. Vegetables  Creamed or fried vegetables. Vegetables in a cheese sauce. Regular canned vegetables. Regular canned tomato sauce and paste. Regular tomato and vegetable juices. Fruits Dried fruits. Canned fruit in light or heavy syrup. Fruit  juice. Meat and Other Protein Products Fatty cuts of meat. Ribs, chicken wings, bacon, sausage, bologna, salami, chitterlings, fatback, hot dogs, bratwurst, and packaged luncheon meats. Salted nuts and seeds. Canned beans with salt. Dairy Whole or 2% milk, cream, half-and-half, and cream cheese. Whole-fat or sweetened yogurt. Full-fat cheeses or blue cheese. Nondairy creamers and whipped toppings. Processed cheese, cheese spreads, or cheese curds. Condiments Onion and garlic salt, seasoned salt, table salt, and sea salt. Canned and packaged gravies. Worcestershire sauce. Tartar sauce. Barbecue sauce. Teriyaki sauce. Soy sauce, including reduced sodium. Steak sauce. Fish sauce. Oyster sauce. Cocktail sauce. Horseradish. Ketchup and mustard. Meat flavorings and tenderizers. Bouillon cubes. Hot sauce. Tabasco sauce. Marinades. Taco seasonings. Relishes. Fats and Oils Butter, stick margarine, lard, shortening, ghee, and bacon fat. Coconut, palm kernel, or palm oils. Regular salad dressings. Other Pickles and olives. Salted popcorn and pretzels. The items listed above may not be a complete list of foods and beverages to avoid. Contact your dietitian for more information. WHERE CAN I FIND MORE INFORMATION? National Heart, Lung, and Blood Institute: [employer]   This information is not intended to replace advice given to you by your health care provider. Make sure you discuss any questions you have with your health care provider.   Document Released: 11/18/2011 Document Revised: 12/20/2014 Document Reviewed: 10/03/2013 Elsevier Interactive Patient Education [employer]

## 2016-07-25 ENCOUNTER — Emergency Department (HOSPITAL_COMMUNITY)
Admission: EM | Admit: 2016-07-25 | Discharge: 2016-07-25 | Disposition: A | Discharge status: Home / Self Care | Payer: 59 | Attending: Dermatology | Admitting: Dermatology

## 2016-07-25 ENCOUNTER — Encounter (HOSPITAL_COMMUNITY): Payer: Self-pay | Admitting: Emergency Medicine

## 2016-07-25 DIAGNOSIS — M7989 Other specified soft tissue disorders: Secondary | ICD-10-CM | POA: Insufficient documentation

## 2016-07-25 DIAGNOSIS — Z5321 Procedure and treatment not carried out due to patient leaving prior to being seen by health care provider: Secondary | ICD-10-CM | POA: Diagnosis not present

## 2016-07-25 NOTE — ED Notes (Signed)
Pt states he is feeling fine and is leaving.

## 2016-07-25 NOTE — ED Triage Notes (Addendum)
Pt reports he used molly for the first time at 0800. Pt reports he has begun to have swelling and pain in all extremities. Pt also feels that his veins are bigger than normal. Also used cocaine last night. No obvious swelling noted. Pt used drug for recreational purposes only. Pt's eyes dilated. No CP. Some SOB since arrival in ED.

## 2016-08-24 ENCOUNTER — Ambulatory Visit: Payer: 59 | Admitting: Family Medicine

## 2024-01-29 ENCOUNTER — Encounter (HOSPITAL_COMMUNITY): Payer: Self-pay | Admitting: *Deleted

## 2024-01-29 ENCOUNTER — Other Ambulatory Visit: Payer: Self-pay

## 2024-01-29 ENCOUNTER — Emergency Department (HOSPITAL_COMMUNITY): Payer: BC Managed Care – PPO

## 2024-01-29 ENCOUNTER — Emergency Department (HOSPITAL_COMMUNITY)
Admission: EM | Admit: 2024-01-29 | Discharge: 2024-01-29 | Disposition: A | Discharge status: Home / Self Care | Payer: BC Managed Care – PPO | Attending: Emergency Medicine | Admitting: Emergency Medicine

## 2024-01-29 DIAGNOSIS — M5451 Vertebrogenic low back pain: Secondary | ICD-10-CM | POA: Diagnosis not present

## 2024-01-29 DIAGNOSIS — M5441 Lumbago with sciatica, right side: Secondary | ICD-10-CM

## 2024-01-29 DIAGNOSIS — M5430 Sciatica, unspecified side: Secondary | ICD-10-CM | POA: Diagnosis present

## 2024-01-29 MED ORDER — KETOROLAC TROMETHAMINE 30 MG/ML IJ SOLN
30.0000 mg | Freq: Once | INTRAMUSCULAR | Status: AC
  Administered 2024-01-29: 30 mg via INTRAMUSCULAR
  Filled 2024-01-29: qty 1

## 2024-01-29 MED ORDER — LIDOCAINE 5 % EX PTCH
2.0000 | MEDICATED_PATCH | CUTANEOUS | Status: DC
  Administered 2024-01-29: 2 via TRANSDERMAL
  Filled 2024-01-29: qty 2

## 2024-01-29 MED ORDER — LIDOCAINE 5 % EX PTCH: 1.0000 | MEDICATED_PATCH | CUTANEOUS | 0 refills | Status: DC

## 2024-01-29 MED ORDER — CYCLOBENZAPRINE HCL 10 MG PO TABS
10.0000 mg | ORAL_TABLET | Freq: Once | ORAL | Status: AC
  Administered 2024-01-29: 10 mg via ORAL
  Filled 2024-01-29: qty 1

## 2024-01-29 MED ORDER — CYCLOBENZAPRINE HCL 10 MG PO TABS: 10.0000 mg | ORAL_TABLET | Freq: Two times a day (BID) | ORAL | 0 refills | Status: DC | PRN

## 2024-01-29 MED ORDER — MELOXICAM 15 MG PO TABS: 15.0000 mg | ORAL_TABLET | Freq: Every day | ORAL | 0 refills | Status: DC | PRN

## 2024-01-29 NOTE — ED Provider Notes (Signed)
EMERGENCY DEPARTMENT AT [facility] Provider Note   CSN: [id number] Arrival date & time: 01/29/24  2952     History  Chief Complaint  Patient presents with   Sciatica    Craig Cabrera is a 48 y.o. male.  HPI   48 year old male presents emergency department with complaints of bilateral buttock pain radiating down to feet.  States that symptoms began yesterday when he was on his way to the gym.  States that he felt like he bent over or twisted the wrong way causing pain.  Reports history of similar symptoms in the past but states that usually involves 1 leg or the other and currently is experiencing both sided symptoms.  Denies any weakness/sensory deficits in lower extremities saddle esthesia, bowel/bladder dysfunction, history of IV drug use, fever, prolonged corticosteroid use.  Does report history of lung cancer.  Has taken no medication for his symptoms.  He said he has seen chiropractor in the outpatient setting which has helped his symptoms with spinal adjustments.  Past medical history significant for hypertension, hyperglycemia, alcohol use, GERD, constipation  Home Medications Prior to Admission medications   Medication Sig Start Date End Date Taking? Authorizing Provider  cyclobenzaprine (FLEXERIL) 10 MG tablet Take 1 tablet (10 mg total) by mouth 2 (two) times daily as needed for muscle spasms. 01/29/24  Yes Sherian Maroon A, PA  lidocaine (LIDODERM) 5 % Place 1 patch onto the skin daily. Remove & Discard patch within 12 hours or as directed by MD 01/29/24  Yes Peter Garter, PA  meloxicam (MOBIC) 15 MG tablet Take 1 tablet (15 mg total) by mouth daily as needed for pain. 01/29/24  Yes Sherian Maroon A, PA  esomeprazole (NEXIUM) 20 MG capsule Take 40 mg by mouth daily at 12 noon.     [provider]      Allergies    Patient has no known allergies.    Review of Systems   Review of Systems  All other systems reviewed and are  negative.   Physical Exam Updated Vital Signs BP 122/76 (BP Location: Left Arm)   Pulse 89   Temp 98.1 F (36.7 C) (Oral)   Resp 16   Wt 77.1 kg   SpO2 98%   BMI 25.10 kg/m  Physical Exam Vitals and nursing note reviewed.  Constitutional:      General: He is not in acute distress.    Appearance: He is well-developed.  HENT:     Head: Normocephalic and atraumatic.  Eyes:     Conjunctiva/sclera: Conjunctivae normal.  Cardiovascular:     Rate and Rhythm: Normal rate and regular rhythm.     Heart sounds: No murmur heard. Pulmonary:     Effort: Pulmonary effort is normal. No respiratory distress.     Breath sounds: Normal breath sounds. No wheezing, rhonchi or rales.  Abdominal:     Palpations: Abdomen is soft.     Tenderness: There is no abdominal tenderness.  Musculoskeletal:        General: No swelling.     Cervical back: Neck supple.     Comments: No midline tenderness lumbar spine without step-off or deformity.  Tenderness to palpation bilateral lumbar spine/buttock region.  Straight leg raise positive bilaterally.  Muscular strength out of 5 lower extremities.  DTR symmetric at patella as well as Achilles tendon.  No sensory deficits along major nerve distributions of lower extremities.  Pedal and posterior tibial pulses 2+ bilaterally.  Skin:  General: Skin is warm and dry.     Capillary Refill: Capillary refill takes less than 2 seconds.  Neurological:     Mental Status: He is alert.  Psychiatric:        Mood and Affect: Mood normal.     ED Results / Procedures / Treatments   Labs (all labs ordered are listed, but only abnormal results are displayed) Labs Reviewed - No data to display  EKG None  Radiology DG Lumbar Spine Complete Result Date: 01/29/2024 CLINICAL DATA:  pain EXAM: LUMBAR SPINE - COMPLETE 4+ VIEW COMPARISON:  None Available. FINDINGS: There are five non-rib bearing lumbar-type vertebral bodies with riblets at T12. There is normal alignment.  There is no evidence for acute fracture or subluxation. Intervertebral disc spaces are relatively preserved with mild multilevel endplate proliferative changes. Atherosclerotic calcifications of the aorta. IMPRESSION: Mild degenerative changes of the lumbar spine. Aortic Atherosclerosis (ICD10-I70.0). Electronically Signed   By: Meda Klinefelter M.D.   On: 01/29/2024 11:26    Procedures Procedures    Medications Ordered in ED Medications  lidocaine (LIDODERM) 5 % 2 patch (2 patches Transdermal Patch Applied 01/29/24 1049)  cyclobenzaprine (FLEXERIL) tablet 10 mg (10 mg Oral Given 01/29/24 1048)  ketorolac (TORADOL) 30 MG/ML injection 30 mg (30 mg Intramuscular Given 01/29/24 1050)    ED Course/ Medical Decision Making/ A&P                                 Medical Decision Making Amount and/or Complexity of Data Reviewed Radiology: ordered.  Risk Prescription drug management.   This patient presents to the ED for concern of back pain, this involves an extensive number of treatment options, and is a complaint that carries with it a high risk of complications and morbidity.  The differential diagnosis includes fracture, strain/pain, dislocation, plantar fasciitis, nephrolithiasis, sciatica, cauda equina, spinal epidural abscess, other spinal cord compression/impingement, aortic dissection, other   Co morbidities that complicate the patient evaluation  See HPI   Additional history obtained:  Additional history obtained from EMR External records from outside source obtained and reviewed including hospital records   Lab Tests:  /na   Imaging Studies ordered:  I ordered imaging studies including x-ray lumbar spine I independently visualized and interpreted imaging which showed degenerative changes.  No acute osseous abnormality. I agree with the radiologist interpretation   Cardiac Monitoring: / EKG:  The patient was maintained on a cardiac monitor.  I personally viewed  and interpreted the cardiac monitored which showed an underlying rhythm of: Sinus rhythm   Consultations Obtained:  N/a   Problem List / ED Course / Critical interventions / Medication management  Low back pain I ordered medication including Toradol, Flexeril, Lidoderm  Reevaluation of the patient after these medicines showed that the patient improved I have reviewed the patients home medicines and have made adjustments as needed   Social Determinants of Health:  Alcohol use.  Denies illicit drug use.   Test / Admission - Considered:  Low back pain Vitals signs within normal range and stable throughout visit. Imaging studies significant for: See see above 48 year old male presents emergency department complaints of low back/buttock pain with radiation down bilateral legs onset yesterday.  Symptoms similar in the past but seems to have mostly affected 1 side and not both at the same time.  No red flag signs for back pain on HPI/PE besides remote history of lung cancer.  Patient with symmetric strength, intact sensation, symmetric pulses, symmetric reflexes.  Low suspicion for cauda equina, spinal epidural abscess, other spinal cord compression/impingement.  X-ray obtained which was negative for any acute osseous abnormality but with some degenerative changes in the lumbar spine.  No pain rating to back, pulse deficits, neurologic deficits, hypertension; low suspicion for aortic dissection.  Patient without urinary symptoms, CVA tenderness; low suspicion for pyelonephritis/nephrolithiasis.  Suspect sciatica as cause of patient's symptoms.  Treated with medications that did note improvement while the emergency department.  Will send home with similar medication as well as do referral for PT for further evaluation/assessment if patient's symptoms refractory to medication sent home with.  Will recommend follow-up with PCP in the outpatient setting for reassessment.  Treatment plan discussed at  length with patient and he acknowledged understanding was agreeable to said plan.  Patient overall well-appearing, afebrile in no acute distress. Worrisome signs and symptoms were discussed with the patient, and the patient acknowledged understanding to return to the ED if noticed. Patient was stable upon discharge.          Final Clinical Impression(s) / ED Diagnoses Final diagnoses:  Acute bilateral low back pain with bilateral sciatica    Rx / DC Orders ED Discharge Orders          Ordered    cyclobenzaprine (FLEXERIL) 10 MG tablet  2 times daily PRN        01/29/24 1135    meloxicam (MOBIC) 15 MG tablet  Daily PRN        01/29/24 1135    lidocaine (LIDODERM) 5 %  Every 24 hours        01/29/24 1135    Ambulatory referral to Physical Therapy       Comments: sciatica   01/29/24 1135              Peter Garter, [place] 01/29/24 1142    Gloris Manchester, MD 01/29/24 [phone]

## 2024-01-29 NOTE — ED Triage Notes (Signed)
BIB family from home for recurrent sciatica sx described as low back pain with BLE radiation, h/o similar, usually goes to UC, but this episode is worse, onset yesterday, last flare 1 month ago, last steroid in October, no meds PTA. Mentions "feet are pink". CMS intact. No red flag sx. Rates 9/10.

## 2024-01-29 NOTE — Discharge Instructions (Addendum)
As discussed, the x-ray of your low back did show some arthritis changes but without significant narrowing of your spine.  Suspect your symptoms are likely secondary from sciatica.  Will send you home with similar medications that we gave you on the emergency department.  See admission attached to your discharge papers regarding rehab exercises.  I have also sent in a referral for physical therapy as I think you will benefit from this.  Recommend follow-up with your primary care for reassessment.  Please do not hesitate to return if the worrisome signs and symptoms we discussed become apparent.

## 2024-02-06 ENCOUNTER — Encounter: Payer: Self-pay | Admitting: Gastroenterology

## 2024-03-27 ENCOUNTER — Encounter: Payer: Self-pay | Admitting: Gastroenterology

## 2024-03-27 ENCOUNTER — Ambulatory Visit: Payer: BC Managed Care – PPO | Admitting: Gastroenterology

## 2024-03-27 VITALS — BP 118/78 | HR 112 | Ht 70.0 in | Wt 171.5 lb

## 2024-03-27 DIAGNOSIS — K219 Gastro-esophageal reflux disease without esophagitis: Secondary | ICD-10-CM

## 2024-03-27 DIAGNOSIS — R053 Chronic cough: Secondary | ICD-10-CM | POA: Diagnosis not present

## 2024-03-27 DIAGNOSIS — Z1211 Encounter for screening for malignant neoplasm of colon: Secondary | ICD-10-CM

## 2024-03-27 MED ORDER — NA SULFATE-K SULFATE-MG SULF 17.5-3.13-1.6 GM/177ML PO SOLN: 1.0000 | Freq: Once | ORAL | 0 refills | Status: AC

## 2024-03-27 NOTE — Progress Notes (Signed)
 Chief Complaint: discuss colonoscopy/EGD Primary GI Doctor:Dr. Barron Alvine   HPI:  Patient is a 48 year old male patient with past medical history of hypertension, GERD, constipation who was referred to me by Lindaann Pascal, PA-C on 12/21/23 to discuss colonoscopy and EGD.    Patient seen in ED on 01/29/2024 for sciatica. X-ray obtained which was negative for any acute osseous abnormality but with some degenerative changes in the lumbar spine.   Interval History     Patient presents to discuss EGD and colonoscopy. Patient states he has had issues with chronic GERD and has been on OTC Nexium 20 mg twice daily for quite some time. He reports drinking beer at night makes his symptoms worse and he will regurgitate it back up upon drinking first beer. He also endorses coughing intermittently that tends to be worse thing in the morning.  Denies allergies or post nasal drip. Denies coughing during night. Patient denies dysphagia. Patient denies nausea, vomiting, or weight loss. Patient denies altered bowel habits, abdominal pain, or rectal bleeding.   He drinks 6-8 beers nightly about 5-6 days a week, drinks more on weekends.  Former smoker for about 25 years, stopped in 2018. He now vapes.  Patients last colonoscopy and EGD approximately 10 plus years ago with Dr. Bosie Clos. He states he was told his colonoscopy was normal and EGD showed GERD.   No known family history of CA or GI issues.  Wt Readings from Last 3 Encounters:  03/27/24 171 lb 8 oz (77.8 kg)  01/29/24 170 lb (77.1 kg)  06/01/16 183 lb (83 kg)    Past Medical History:  Diagnosis Date   Alcohol use    40 per week (6 beers about 5x a week- goes out)   Cardiac murmur    Chicken pox    Constipation    colonoscopy around 2006 was normal   GERD (gastroesophageal reflux disease)    nexium OTC. Has seen [facility] for endoscopy- burning sensation in epigastric area in AM- only "alcohol irritation"   HTN (hypertension)    Past Surgical  History:  Procedure Laterality Date   KNEE ARTHROCENTESIS     Current Outpatient Medications  Medication Sig Dispense Refill   esomeprazole (NEXIUM) 20 MG capsule Take 40 mg by mouth daily at 12 noon.      lisinopril (ZESTRIL) 10 MG tablet Take 10 mg by mouth daily.     tadalafil (CIALIS) 5 MG tablet Take 5 mg by mouth daily.     No current facility-administered medications for this visit.    Allergies as of 03/27/2024   (No Known Allergies)    Family History  Problem Relation Age of Onset   Healthy Mother        father   Breast cancer Mother    Lung cancer Other        3/4 grandparents smokers    Review of Systems:    Constitutional: No weight loss, fever, chills, weakness or fatigue HEENT: Eyes: No change in vision               Ears, Nose, Throat:  No change in hearing or congestion Skin: No rash or itching Cardiovascular: No chest pain, chest pressure or palpitations   Respiratory: No SOB or cough Gastrointestinal: See HPI and otherwise negative Genitourinary: No dysuria or change in urinary frequency Neurological: No headache, dizziness or syncope Musculoskeletal: No new muscle or joint pain Hematologic: No bleeding or bruising Psychiatric: No history of depression or anxiety  Physical Exam:  Vital signs: BP 118/78   Pulse (!) 112   Ht 5\' 10"  (1.778 m)   Wt 171 lb 8 oz (77.8 kg)   BMI 24.61 kg/m   Constitutional:  Pleasant male appears to be in NAD, Well developed, Well nourished, alert and cooperative Throat: Oral cavity and pharynx without inflammation, swelling or lesion.  Respiratory: Respirations even and unlabored. Lungs clear to auscultation bilaterally.   No wheezes, crackles, or rhonchi.  Cardiovascular: Normal S1, S2. Regular rate and rhythm. No peripheral edema, cyanosis or pallor.  Gastrointestinal:  Soft, nondistended, nontender. No rebound or guarding. Normal bowel sounds. No appreciable masses or hepatomegaly. Rectal:  Not performed.  Msk:   Symmetrical without gross deformities. Without edema, no deformity or joint abnormality.  Neurologic:  Alert and  oriented x4;  grossly normal neurologically.  Skin:   Dry and intact without significant lesions or rashes. Psychiatric: Oriented to person, place and time. Demonstrates good judgement and reason without abnormal affect or behaviors.  RELEVANT LABS AND IMAGING: CBC    Latest Ref Rng & Units 05/25/2016    8:10 AM  CBC  WBC 4.0 - 10.5 K/uL 7.4   Hemoglobin 13.0 - 17.0 g/dL 16.1   Hematocrit 09.6 - 52.0 % 44.6   Platelets 150.0 - 400.0 K/uL 224.0      CMP     Latest Ref Rng & Units 05/25/2016    8:10 AM  CMP  Glucose 70 - 99 mg/dL 045   BUN 6 - 23 mg/dL 15   Creatinine 4.09 - 1.50 mg/dL 8.11   Sodium 914 - 782 mEq/L 139   Potassium 3.5 - 5.1 mEq/L 3.6   Chloride 96 - 112 mEq/L 102   CO2 19 - 32 mEq/L 28   Calcium 8.4 - 10.5 mg/dL 9.6   Total Protein 6.0 - 8.3 g/dL 7.2   Total Bilirubin 0.2 - 1.2 mg/dL 0.6   Alkaline Phos 39 - 117 U/L 72   AST 0 - 37 U/L 16   ALT 0 - 53 U/L 25      Lab Results  Component Value Date   TSH 1.97 05/25/2016     Assessment: Encounter Diagnoses  Name Primary?   Gastroesophageal reflux disease, unspecified whether esophagitis present Yes   Special screening for malignant neoplasms, colon    Chronic cough      48 year old male patient with chronic GERD poorly controlled on Nexium 20mg  BID. Reinforced GERD diet, no late meals or alcohol 3-4 hours before lying down. Will schedule upper GI endoscopy to evaluate for esophagitis and/or Barretts. His chronic cough Dennison Mcdaid or Anadalay Macdonell not be related to LPR? We discussed adding Pepcid at bedtime to see if this helps. I also explained to him how the alcohol consumpation Rily Nickey exacerbate his issues.   Patient also due for colon screening colonoscopy, will schedule today. Denies any lower GI symptoms.  Plan: -Continue Nexium 20 mg twice daily, Corwin Kuiken consider changing PPI therapy after EGD. -OTC Pepcid prn  for breakthrough symptoms -Strict GERd diet, no late meals 3-4 hours before lying down. -Schedule for a colonoscopy in [facility] with Dr. Barron Alvine. The risks and benefits of colonoscopy with possible polypectomy / biopsies were discussed and the patient agrees to proceed.  - Schedule EGD in [facility] with Dr. Barron Alvine. The risks and benefits of EGD with possible biopsies and esophageal dilation were discussed with the patient who agrees to proceed.    Thank you for the courtesy of this consult.  Please call me with any questions or concerns.   Irisa Grimsley, FNP-C [facility] 03/27/2024, 9:00 AM  Cc: Victor Grapes, PA-C

## 2024-03-27 NOTE — Patient Instructions (Addendum)
 Continue Nexium 20 mg twice daily Can add OTC Pepcid prn for breakthrough symptoms Avoid eating or drinking 3-4 hours before lying down Elevated head of bed at night GERD diet We have sent the following medications to your pharmacy for you to pick up at your convenience:  SUPREP  You have been scheduled for an endoscopy and colonoscopy. Please follow the written instructions given to you at your visit today.  If you use inhalers (even only as needed), please bring them with you on the day of your procedure.  DO NOT TAKE 7 DAYS PRIOR TO TEST- Trulicity (dulaglutide) Ozempic, Wegovy (semaglutide) Mounjaro (tirzepatide) Bydureon Bcise (exanatide extended release)  DO NOT TAKE 1 DAY PRIOR TO YOUR TEST Rybelsus (semaglutide) Adlyxin (lixisenatide) Victoza (liraglutide) Byetta (exanatide) __________________________________________________________________________ Due to recent changes in healthcare laws, you may see the results of your imaging and laboratory studies on MyChart before your provider has had a chance to review them.  We understand that in some cases there may be results that are confusing or concerning to you. Not all laboratory results come back in the same time frame and the provider may be waiting for multiple results in order to interpret others.  Please give us  49 hours in order for your provider to thoroughly review all the results before contacting the office for clarification of your results.  _______________________________________________________  If your blood pressure at your visit was 140/90 or greater, please contact your primary care physician to follow up on this.  _______________________________________________________  If you are age 49 or older, your body mass index should be between 23-30. Your Body mass index is 24.61 kg/m. If this is out of the aforementioned range listed, please consider follow up with your Primary Care Provider.  If you are age 50 or  younger, your body mass index should be between 19-25. Your Body mass index is 24.61 kg/m. If this is out of the aformentioned range listed, please consider follow up with your Primary Care Provider.   ________________________________________________________  The [facility] providers would like to encourage you to use MYCHART to communicate with providers for non-urgent requests or questions.  Due to long hold times on the telephone, sending your provider a message by [facility] may be a faster and more efficient way to get a response.  Please allow 48 business hours for a response.  Please remember that this is for non-urgent requests.  _______________________________________________________ Thank you for trusting me with your gastrointestinal care!   Dyanna Glasgow, NP

## 2024-04-09 NOTE — Progress Notes (Signed)
 Agree with the assessment and plan as outlined by [facility], FNP-C.  Carlitos Bottino, DO, [facility]

## 2024-04-13 ENCOUNTER — Encounter: Payer: Self-pay | Admitting: Cardiology

## 2024-04-13 ENCOUNTER — Ambulatory Visit: Payer: BC Managed Care – PPO | Attending: Cardiology | Admitting: Cardiology

## 2024-04-13 VITALS — BP 120/74 | HR 82 | Ht 70.0 in | Wt 169.2 lb

## 2024-04-13 DIAGNOSIS — I10 Essential (primary) hypertension: Secondary | ICD-10-CM

## 2024-04-13 DIAGNOSIS — R011 Cardiac murmur, unspecified: Secondary | ICD-10-CM

## 2024-04-13 DIAGNOSIS — Z13220 Encounter for screening for lipoid disorders: Secondary | ICD-10-CM | POA: Diagnosis not present

## 2024-04-13 DIAGNOSIS — R6889 Other general symptoms and signs: Secondary | ICD-10-CM | POA: Insufficient documentation

## 2024-04-13 NOTE — Patient Instructions (Addendum)
 Lab Work: Lipid panel  If you have labs (blood work) drawn today and your tests are completely normal, you will receive your results only by: MyChart Message (if you have MyChart) OR A paper copy in the mail If you have any lab test that is abnormal or we need to change your treatment, we will call you to review the results.  Testing/Procedures:  ECHO  Your physician has requested that you have an echocardiogram. Echocardiography is a painless test that uses sound waves to create images of your heart. It provides your doctor with information about the size and shape of your heart and how well your heart's chambers and valves are working. This procedure takes approximately one hour. There are no restrictions for this procedure. Please do NOT wear cologne, perfume, aftershave, or lotions (deodorant is allowed). Please arrive 15 minutes prior to your appointment time.  Please note: We ask at that you not bring children with you during ultrasound (echo/ vascular) testing. Due to room size and safety concerns, children are not allowed in the ultrasound rooms during exams. Our front office staff cannot provide observation of children in our lobby area while testing is being conducted. An adult accompanying a patient to their appointment will only be allowed in the ultrasound room at the discretion of the ultrasound technician under special circumstances. We apologize for any inconvenience.  CT CALCIUM SCORE  CT scanning for a cardiac calcium score (CAT scanning), is a noninvasive, special x-ray that produces cross-sectional images of the body using x-rays and a computer. CT scans help physicians diagnose and treat medical conditions. For some CT exams, a contrast material is used to enhance visibility in the area of the body being studied. CT scans provide greater clarity and reveal more details than regular x-ray exams.   Gxt  Exercise Tolerance Test  Please arrive 15 minutes prior to your  appointment time for registration and insurance purposes.  The test will take approximately 45 minutes to complete.  How to prepare for your Exercise Stress Test: Do bring a list of your current medications with you.  If not listed below, you may take your medications as normal. Do wear comfortable clothes (no dresses or overalls) and walking shoes, tennis shoes preferred (no heels or open toed shoes are allowed) Do Not wear cologne, perfume, aftershave or lotions (deodorant is allowed). Please report to [address], [address] for your test.  If these instructions are not followed, your test will have to be rescheduled.  If you have questions or concerns about your appointment, you can call the Stress Lab at [phone].  If you cannot keep your appointment, please provide 24 hours notification to the Stress Lab, to avoid a possible $50 charge to your account.   Follow-Up: At [facility], you and your health needs are our priority.  As part of our continuing mission to provide you with exceptional heart care, our providers are all part of one team.  This team includes your primary Cardiologist (physician) and Advanced Practice Providers or APPs (Physician Assistants and Nurse Practitioners) who all work together to provide you with the care you need, when you need it.  Your next appointment:   AS NEEDED   Provider:   Cody Das, MD    We recommend signing up for the patient portal called "MyChart".  Sign up information is provided on this After Visit Summary.  MyChart is used to connect with patients for Virtual Visits (Telemedicine).  Patients  are able to view lab/test results, encounter notes, upcoming appointments, etc.  Non-urgent messages can be sent to your provider as well.   To learn more about what you can do with MyChart, go to [email or web address].

## 2024-04-13 NOTE — Progress Notes (Signed)
 Cardiology Office Note:  .   Date:  04/13/2024  ID:  Craig Cabrera, DOB 06-02-1976, MRN [id number] PCP: Victor Grapes, PA-C  [facility] Providers Cardiologist:  Fransico Ivy, MD PCP: Victor Grapes, PA-C  Chief Complaint  Patient presents with   Hypertension     Craig Cabrera is a 48 y.o. male with hypertension, 60-pack-year smoking history, family history of murmur, decreased exercise tolerance  Discussed the use of AI scribe software for clinical note transcription with the patient, who gave verbal consent to proceed.  History of Present Illness Craig Cabrera is a 48 year old male with hypertension who presents for evaluation of exercise intolerance and past episodes of tachycardia. He was referred by a previous doctor for evaluation of high heart rate episodes.  Approximately eight years ago, he experienced episodes of elevated heart rate, reaching 150-170 bpm, leading to a referral for further evaluation. Since starting lisinopril, his blood pressure and heart rate have stabilized, with no recent episodes of tachycardia.  He experiences exercise intolerance, particularly with aerobic activities, due to fatigue and coughing. He avoids cardio exercises and focuses on weight training. Coughing fits occur sometimes after eating and during aerobic activities. No recent episodes of tachycardia, chest pain, or shortness of breath during physical activity.  He has a significant smoking history, having smoked two packs per day for approximately 30 years before quitting in 2018. He currently vapes one to two pods daily. He has a past history of cocaine use for two years, coinciding with the onset of his heart rate issues.  Family history is notable for heart murmurs in both parents and an uncle, and his paternal grandfather died of a heart attack at 47 years old.    Vitals:   04/13/24 0842  BP: 120/74  Pulse: 82  SpO2: 99%      Review of Systems  Cardiovascular:  Negative for  chest pain, dyspnea on exertion, leg swelling, palpitations and syncope.        Studies Reviewed: Craig Cabrera        EKG 04/13/2024: Normal sinus rhythm Normal ECG When compared with ECG of 13-Apr-2024 08:44,  lead reversal is corrected     Physical Exam Vitals and nursing note reviewed.  Constitutional:      General: He is not in acute distress. Neck:     Vascular: No JVD.  Cardiovascular:     Rate and Rhythm: Normal rate and regular rhythm.     Heart sounds: Murmur heard.     High-pitched blowing holosystolic murmur is present with a grade of 2/6 at the apex.  Pulmonary:     Effort: Pulmonary effort is normal.     Breath sounds: Normal breath sounds. No wheezing or rales.  Musculoskeletal:     Right lower leg: No edema.     Left lower leg: No edema.      VISIT DIAGNOSES:   ICD-10-CM   1. Primary hypertension  I10 EKG 12-Lead    EKG 12-Lead    2. Murmur  R01.1 ECHOCARDIOGRAM COMPLETE    3. Decreased exercise tolerance  R68.89 CT CARDIAC SCORING (SELF PAY ONLY)    Exercise Tolerance Test    Cardiac Stress Test: Informed Consent Details: Physician/Practitioner Attestation; Transcribe to consent form and obtain patient signature    4. Screening cholesterol level  Z13.220 Lipid panel       Craig Cabrera is a 48 y.o. male with hypertension, 60-pack-year smoking history, family history of murmur, decreased exercise  tolerance Assessment and Plan Assessment & Plan Decreased exercise tolerance: Reports decreased exercise tolerance with aerobic activities. Differential includes cardiac causes and reactive airway disease. - Order exercise treadmill stress test. - Recommend discussing lung function test with primary doctor.  Heart murmur: Soft heart murmur detected. Family history of heart murmurs and heart disease. Further evaluation warranted. - Order echocardiogram.  Hypertension: Hypertension well-controlled with lisinopril. No recent episodes of elevated heart  rate.  Tobacco use Significant smoking history, quit in 2018. Currently vapes. Discussed risks of vaping and encouraged cessation. - Order CT scan to assess for coronary artery calcium.  Cocaine use: Cocaine use eight years ago may have contributed to past elevated heart rate and blood pressure. No current use reported. Discussed potential use of wearable technology for monitoring heart rhythm.     Informed Consent   Shared Decision Making/Informed Consent The risks [chest pain, shortness of breath, cardiac arrhythmias, dizziness, blood pressure fluctuations, myocardial infarction, stroke/transient ischemic attack, and life-threatening complications (estimated to be 1 in 10,000)], benefits (risk stratification, diagnosing coronary artery disease, treatment guidance) and alternatives of an exercise tolerance test were discussed in detail with Craig Cabrera and he agrees to proceed.        F/u as needed  Signed, Cody Das, MD

## 2024-04-14 LAB — LIPID PANEL
Chol/HDL Ratio: 3.8 ratio (ref 0.0–5.0)
Cholesterol, Total: 201 mg/dL — ABNORMAL HIGH (ref 100–199)
HDL: 53 mg/dL (ref >39)
LDL Chol Calc (NIH): 128 mg/dL — ABNORMAL HIGH (ref 0–99)
Triglycerides: 111 mg/dL (ref 0–149)
VLDL Cholesterol Cal: 20 mg/dL (ref 5–40)

## 2024-04-16 NOTE — Progress Notes (Signed)
 Cholesterol is elevated.  In addition to heart healthy diet and lifestyle, we could consider starting low-dose statin Crestor 10 mg daily.  Patient is not ready to start statin yet, can wait until after getting results of coronary calcium score scan.  Thanks MJP

## 2024-04-17 ENCOUNTER — Telehealth: Payer: Self-pay | Admitting: *Deleted

## 2024-04-17 NOTE — Telephone Encounter (Signed)
Left detailed instructions for ETT.

## 2024-04-19 ENCOUNTER — Telehealth: Payer: Self-pay | Admitting: Cardiology

## 2024-04-19 NOTE — Telephone Encounter (Signed)
Follow Up:     Patient is returning  call, concerning his results.

## 2024-04-19 NOTE — Telephone Encounter (Signed)
 Spoke with patient and advised of lab results per Dr Filiberto Hug.  Pt states he would prefer not to start medication until after calcium score CT.  Pt advised will forward to Dr Filiberto Hug and his RN to make them aware.  Pt thanked Charity fundraiser for the call.

## 2024-04-20 NOTE — Telephone Encounter (Signed)
 Noted.  Thanks MJP

## 2024-04-26 ENCOUNTER — Ambulatory Visit (HOSPITAL_COMMUNITY): Attending: Cardiology

## 2024-04-26 DIAGNOSIS — R6889 Other general symptoms and signs: Secondary | ICD-10-CM

## 2024-04-27 ENCOUNTER — Ambulatory Visit: Payer: Self-pay | Admitting: Cardiology

## 2024-04-27 DIAGNOSIS — R931 Abnormal findings on diagnostic imaging of heart and coronary circulation: Secondary | ICD-10-CM

## 2024-04-27 LAB — EXERCISE TOLERANCE TEST
Angina Index: 0
Duke Treadmill Score: 10
Estimated workload: 12.5
Exercise duration (min): 10 min
Exercise duration (sec): 28 s
MPHR: 172 {beats}/min
Peak HR: 144 {beats}/min
Percent HR: 83 %
Rest HR: 82 {beats}/min
ST Depression (mm): 0 mm

## 2024-04-30 ENCOUNTER — Ambulatory Visit (HOSPITAL_BASED_OUTPATIENT_CLINIC_OR_DEPARTMENT_OTHER)
Admission: RE | Admit: 2024-04-30 | Discharge: 2024-04-30 | Disposition: A | Discharge status: Home / Self Care | Payer: Self-pay | Source: Ambulatory Visit | Attending: Cardiology | Admitting: Cardiology

## 2024-04-30 DIAGNOSIS — R6889 Other general symptoms and signs: Secondary | ICD-10-CM | POA: Insufficient documentation

## 2024-04-30 NOTE — Progress Notes (Signed)
 High amount of calcium noted in the heart arteries.  This does not necessarily suggest severe blockages.  Your exercise treadmill stress test was rather inconclusive as target heart rate was not reached.  With the new finding of high coronary calcification, I recommend exercise nuclear stress test for more specific information.  Continue Crestor 10 mg daily.  Recommend repeat lipid panel in 3 months.  Thanks MJP

## 2024-05-02 ENCOUNTER — Other Ambulatory Visit: Payer: Self-pay

## 2024-05-02 DIAGNOSIS — R931 Abnormal findings on diagnostic imaging of heart and coronary circulation: Secondary | ICD-10-CM

## 2024-05-02 MED ORDER — ROSUVASTATIN CALCIUM 10 MG PO TABS: 10.0000 mg | ORAL_TABLET | Freq: Every day | ORAL | 3 refills | Status: AC

## 2024-05-03 ENCOUNTER — Telehealth: Payer: Self-pay | Admitting: *Deleted

## 2024-05-03 NOTE — Telephone Encounter (Signed)
Per DPR left detailed instructions for MPI study.

## 2024-05-08 ENCOUNTER — Other Ambulatory Visit: Payer: Self-pay | Admitting: Cardiology

## 2024-05-08 DIAGNOSIS — R931 Abnormal findings on diagnostic imaging of heart and coronary circulation: Secondary | ICD-10-CM

## 2024-05-10 ENCOUNTER — Ambulatory Visit (HOSPITAL_COMMUNITY): Admission: RE | Admit: 2024-05-10 | Source: Ambulatory Visit | Attending: Cardiology | Admitting: Cardiology

## 2024-05-18 ENCOUNTER — Ambulatory Visit: Payer: Self-pay | Admitting: Cardiology

## 2024-05-18 ENCOUNTER — Ambulatory Visit (HOSPITAL_COMMUNITY)
Admission: RE | Admit: 2024-05-18 | Discharge: 2024-05-18 | Disposition: A | Discharge status: Home / Self Care | Source: Ambulatory Visit | Attending: Internal Medicine | Admitting: Internal Medicine

## 2024-05-18 DIAGNOSIS — R011 Cardiac murmur, unspecified: Secondary | ICD-10-CM | POA: Diagnosis present

## 2024-05-18 DIAGNOSIS — R931 Abnormal findings on diagnostic imaging of heart and coronary circulation: Secondary | ICD-10-CM

## 2024-05-18 LAB — MYOCARDIAL PERFUSION IMAGING
Base ST Depression (mm): 0 mm
LV dias vol: 118 mL (ref 62–150)
LV sys vol: 43 mL
Nuc Stress EF: 64 %
Peak HR: 99 {beats}/min
Rest HR: 70 {beats}/min
Rest Nuclear Isotope Dose: 10.7 mCi
SDS: 0
SRS: 7
SSS: 2
ST Depression (mm): 0 mm
Stress Nuclear Isotope Dose: 32.4 mCi
TID: 1.02

## 2024-05-18 LAB — ECHOCARDIOGRAM COMPLETE
Area-P 1/2: 3.16 cm2
S' Lateral: 3.72 cm

## 2024-05-18 MED ORDER — TECHNETIUM TC 99M TETROFOSMIN IV KIT
32.4000 | PACK | Freq: Once | INTRAVENOUS | Status: AC | PRN
  Administered 2024-05-18: 32.4 via INTRAVENOUS

## 2024-05-18 MED ORDER — REGADENOSON 0.4 MG/5ML IV SOLN
INTRAVENOUS | Status: AC
  Filled 2024-05-18: qty 5

## 2024-05-18 MED ORDER — REGADENOSON 0.4 MG/5ML IV SOLN
0.4000 mg | Freq: Once | INTRAVENOUS | Status: AC
  Administered 2024-05-18: 0.4 mg via INTRAVENOUS

## 2024-05-18 MED ORDER — TECHNETIUM TC 99M TETROFOSMIN IV KIT
10.7000 | PACK | Freq: Once | INTRAVENOUS | Status: AC | PRN
Start: 2024-05-18 — End: 2024-05-18
  Administered 2024-05-18: 10.7 via INTRAVENOUS

## 2024-05-18 MED ORDER — PERFLUTREN LIPID MICROSPHERE
1.0000 mL | INTRAVENOUS | Status: AC | PRN
  Administered 2024-05-18: 2 mL via INTRAVENOUS

## 2024-05-18 NOTE — Progress Notes (Signed)
 Normal pumping function of the heart. No severe heart valve abnormalities noted.  See other note re: stress test results  Thanks MJP

## 2024-05-18 NOTE — Progress Notes (Signed)
 No significant heart muscle circulation abnormalities noted on stress test. Aspirin not needed, but still recommend statin to reduce cholesterol and further plaque buildup. Repeat lipid panel 3 months as previously recommended.  Thanks MJP

## 2024-05-23 ENCOUNTER — Encounter: Admitting: Gastroenterology

## 2024-06-11 ENCOUNTER — Encounter: Payer: Self-pay | Admitting: Gastroenterology

## 2024-06-11 ENCOUNTER — Ambulatory Visit: Admitting: Gastroenterology

## 2024-06-11 VITALS — BP 110/78 | HR 54 | Temp 98.1°F | Resp 15 | Ht 70.0 in | Wt 167.6 lb

## 2024-06-11 DIAGNOSIS — D124 Benign neoplasm of descending colon: Secondary | ICD-10-CM

## 2024-06-11 DIAGNOSIS — K219 Gastro-esophageal reflux disease without esophagitis: Secondary | ICD-10-CM

## 2024-06-11 DIAGNOSIS — K635 Polyp of colon: Secondary | ICD-10-CM

## 2024-06-11 DIAGNOSIS — Z1211 Encounter for screening for malignant neoplasm of colon: Secondary | ICD-10-CM

## 2024-06-11 DIAGNOSIS — K641 Second degree hemorrhoids: Secondary | ICD-10-CM

## 2024-06-11 DIAGNOSIS — K3189 Other diseases of stomach and duodenum: Secondary | ICD-10-CM | POA: Diagnosis not present

## 2024-06-11 DIAGNOSIS — D125 Benign neoplasm of sigmoid colon: Secondary | ICD-10-CM

## 2024-06-11 DIAGNOSIS — K319 Disease of stomach and duodenum, unspecified: Secondary | ICD-10-CM | POA: Diagnosis not present

## 2024-06-11 DIAGNOSIS — K2970 Gastritis, unspecified, without bleeding: Secondary | ICD-10-CM

## 2024-06-11 MED ORDER — SODIUM CHLORIDE 0.9 % IV SOLN: 500.0000 mL | Freq: Once | INTRAVENOUS | Status: DC

## 2024-06-11 NOTE — Progress Notes (Signed)
 [id number] Patient experiencing nausea and retching.  MD updated and Zofran 4 mg IV given, vss

## 2024-06-11 NOTE — Op Note (Signed)
 [facility] Patient Name: Craig Cabrera Procedure Date: 06/11/2024 7:32 AM MRN: [id number] Endoscopist: Sandor Flatter , MD, [id number] Age: 48 Referring MD:  Date of Birth: January 12, 1976 Gender: Male Account #: [id number] Procedure:                Upper GI endoscopy Indications:              Suspected esophageal reflux, Chronic cough,                            Regurgitation Medicines:                Monitored Anesthesia Care Procedure:                Pre-Anesthesia Assessment:                           - Prior to the procedure, a History and Physical                            was performed, and patient medications and                            allergies were reviewed. The patient's tolerance of                            previous anesthesia was also reviewed. The risks                            and benefits of the procedure and the sedation                            options and risks were discussed with the patient.                            All questions were answered, and informed consent                            was obtained. Prior Anticoagulants: The patient has                            taken no anticoagulant or antiplatelet agents. ASA                            Grade Assessment: II - A patient with mild systemic                            disease. After reviewing the risks and benefits,                            the patient was deemed in satisfactory condition to                            undergo the procedure.  After obtaining informed consent, the endoscope was                            passed under direct vision. Throughout the                            procedure, the patient's blood pressure, pulse, and                            oxygen saturations were monitored continuously. The                            Olympus Scope [id number] was introduced through the                            mouth, and advanced to the second part of  duodenum.                            The upper GI endoscopy was accomplished without                            difficulty. The patient tolerated the procedure                            well. Scope In: Scope Out: Findings:                 The examined esophagus was normal.                           The gastroesophageal flap valve was visualized                            endoscopically and classified as Hill Grade III                            (minimal fold, loose to endoscope, hiatal hernia                            likely).                           Scattered mild inflammation characterized by                            congestion (edema) and erythema was found in the                            gastric fundus, in the gastric body and in the                            gastric antrum. Biopsies were taken with a cold                            forceps for Helicobacter pylori testing. Estimated  blood loss was minimal.                           The examined duodenum was normal. Complications:            No immediate complications. Estimated Blood Loss:     Estimated blood loss was minimal. Impression:               - Normal esophagus.                           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                           - Mild, non-ulcer gastritis. Biopsied.                           - Normal examined duodenum. Recommendation:           - Patient has a contact number available for                            emergencies. The signs and symptoms of potential                            delayed complications were discussed with the                            patient. Return to normal activities tomorrow.                            Written discharge instructions were provided to the                            patient.                            - Resume previous diet.                           - Continue present medications.                           - Await pathology results. Sandor Flatter, MD 06/11/2024 8:30:03 AM

## 2024-06-11 NOTE — Op Note (Signed)
  [facility] Patient Name: Craig Cabrera Procedure Date: 06/11/2024 7:31 AM MRN: [id number] Endoscopist: Sandor Flatter , MD, [id number] Age: 48 Referring MD:  Date of Birth: 11-18-1976 Gender: Male Account #: [id number] Procedure:                Colonoscopy Indications:              Screening for colorectal malignant neoplasm (last                            colonoscopy was 10 years ago) Medicines:                Monitored Anesthesia Care Procedure:                Pre-Anesthesia Assessment:                           - Prior to the procedure, a History and Physical                            was performed, and patient medications and                            allergies were reviewed. The patient's tolerance of                            previous anesthesia was also reviewed. The risks                            and benefits of the procedure and the sedation                            options and risks were discussed with the patient.                            All questions were answered, and informed consent                            was obtained. Prior Anticoagulants: The patient has                            taken no anticoagulant or antiplatelet agents. ASA                            Grade Assessment: II - A patient with mild systemic                            disease. After reviewing the risks and benefits,                            the patient was deemed in satisfactory condition to                            undergo the procedure.  After obtaining informed consent, the colonoscope                            was passed under direct vision. Throughout the                            procedure, the patient's blood pressure, pulse, and                            oxygen saturations were monitored continuously. The                            CF [id number] #[id number] was introduced through the anus                            and advanced to the the  terminal ileum. The                            colonoscopy was performed without difficulty. The                            patient tolerated the procedure well. The quality                            of the bowel preparation was good. The terminal                            ileum, ileocecal valve, appendiceal orifice, and                            rectum were photographed. Scope In: 8:10:02 AM Scope Out: 8:25:01 AM Scope Withdrawal Time: 0 hours 11 minutes 57 seconds  Total Procedure Duration: 0 hours 14 minutes 59 seconds  Findings:                 The perianal and digital rectal examinations were                            normal.                           A 6 mm polyp was found in the descending colon. The                            polyp was sessile. The polyp was removed with a                            cold snare. Resection and retrieval were complete.                            Estimated blood loss was minimal.                           A 3 mm polyp was found in the sigmoid colon. The  polyp was sessile. The polyp was removed with a                            cold snare. Resection and retrieval were complete.                            Estimated blood loss was minimal.                           Non-bleeding internal hemorrhoids were found during                            retroflexion. The hemorrhoids were small and Grade                            II (internal hemorrhoids that prolapse but reduce                            spontaneously).                           The terminal ileum appeared normal. Complications:            No immediate complications. Estimated Blood Loss:     Estimated blood loss was minimal. Impression:               - One 6 mm polyp in the descending colon, removed                            with a cold snare. Resected and retrieved.                           - One 3 mm polyp in the sigmoid colon, removed with                             a cold snare. Resected and retrieved.                           - Non-bleeding internal hemorrhoids.                           - The examined portion of the ileum was normal. Recommendation:           - Patient has a contact number available for                            emergencies. The signs and symptoms of potential                            delayed complications were discussed with the                            patient. Return to normal activities tomorrow.  Written discharge instructions were provided to the                            patient.                           - Resume previous diet.                           - Continue present medications.                           - Await pathology results.                           - Repeat colonoscopy for surveillance based on                            pathology results.                           - Return to GI office PRN. Sandor Flatter, MD 06/11/2024 8:33:07 AM

## 2024-06-11 NOTE — Progress Notes (Signed)
 GASTROENTEROLOGY PROCEDURE H&P NOTE   Primary Care Physician: Darra Hamilton, PA-C    Reason for Procedure:  GERD, regurgitation, chronic cough colon cancer screening  Plan:    EGD, colonoscopy  Patient is appropriate for endoscopic procedure(s) in the ambulatory ([facility]) setting.  The nature of the procedure, as well as the risks, benefits, and alternatives were carefully and thoroughly reviewed with the patient. Ample time for discussion and questions allowed. The patient understood, was satisfied, and agreed to proceed.     HPI: Craig Cabrera is a 48 y.o. male who presents for EGD for evaluation of GERD, chronic cough, and regurgitation.  Symptoms incompletely controlled with Nexium 20 mg twice daily.  Recently added Pepcid 20 mg at bedtime.  Also presents for colonoscopy for routine CRC screening.  Last colonoscopy was 10+ years ago at outside facility and normal per patient.  Past Medical History:  Diagnosis Date   Alcohol use    40 per week (6 beers about 5x a week- goes out)   Cardiac murmur    Chicken pox    Constipation    colonoscopy around 2006 was normal   GERD (gastroesophageal reflux disease)    nexium OTC. Has seen [facility] for endoscopy- burning sensation in epigastric area in AM- only alcohol irritation   HTN (hypertension)     Past Surgical History:  Procedure Laterality Date   KNEE ARTHROCENTESIS      Prior to Admission medications   Medication Sig Start Date End Date Taking? Authorizing Provider  esomeprazole (NEXIUM) 20 MG capsule Take 40 mg by mouth daily at 12 noon.     [provider]  lisinopril (ZESTRIL) 10 MG tablet Take 10 mg by mouth daily. 03/21/24   [provider]  rosuvastatin  (CRESTOR ) 10 MG tablet Take 1 tablet (10 mg total) by mouth daily. 05/02/24   Patwardhan, Newman PARAS, MD  tadalafil (CIALIS) 5 MG tablet Take 5 mg by mouth daily.    [provider]  testosterone (ANDROGEL) 50 MG/5GM (1%) GEL  04/06/24   [provider]    Current Outpatient Medications  Medication Sig Dispense Refill   esomeprazole (NEXIUM) 20 MG capsule Take 40 mg by mouth daily at 12 noon.      lisinopril (ZESTRIL) 10 MG tablet Take 10 mg by mouth daily.     rosuvastatin  (CRESTOR ) 10 MG tablet Take 1 tablet (10 mg total) by mouth daily. 90 tablet 3   tadalafil (CIALIS) 5 MG tablet Take 5 mg by mouth daily.     testosterone (ANDROGEL) 50 MG/5GM (1%) GEL      No current facility-administered medications for this visit.    Allergies as of 06/11/2024   (No Known Allergies)    Family History  Problem Relation Age of Onset   Healthy Mother        father   Breast cancer Mother    Lung cancer Other        3/4 grandparents smokers    Social History   Socioeconomic History   Marital status: Single    Spouse name: Not on file   Number of children: 0   Years of education: Not on file   Highest education level: Not on file  Occupational History   Not on file  Tobacco Use   Smoking status: Former    Current packs/day: 1.00    Types: Cigarettes   Smokeless tobacco: Never  Vaping Use   Vaping status: Every Day   Substances:  Nicotine  Substance and Sexual Activity   Alcohol use: Yes    Alcohol/week: 40.0 standard drinks of alcohol    Types: 40 Cans of beer per week   Drug use: No   Sexual activity: Not on file  Other Topics Concern   Not on file  Social History Narrative   Family: Single, engaged. Together 7 years in 2017. No date for marriage yet. 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   Social Drivers of Corporate investment banker Strain: Not on [employer] Insecurity: Not on file  Transportation Needs: Not on file  Physical Activity: Not on file  Stress: Not on file  Social Connections: Not on file  Intimate Partner Violence: Not on file    Physical Exam: Vital signs in last 24 hours: @BP  134/81   Pulse 65   Temp 98.1 F (36.7 C)    Ht 5' 10 (1.778 m)   Wt 167 lb 9.6 oz (76 kg)   SpO2 100%   BMI 24.05 kg/m  GEN: NAD EYE: Sclerae anicteric ENT: MMM CV: Non-tachycardic Pulm: CTA b/l GI: Soft, NT/ND NEURO:  Alert & Oriented x 3   Sandor Flatter, DO [facility]   06/11/2024 7:38 AM

## 2024-06-11 NOTE — Progress Notes (Signed)
 Pt experienced laryngeal spasm with jaw thrust  performed. vss

## 2024-06-11 NOTE — Patient Instructions (Signed)
 Resume previous diet and medications. Awaiting pathology results. Repeat Colonoscopy date to be determined based on pathology results. Handouts provided on Gastritis, colon polyps and Hemorrhoids  YOU HAD AN ENDOSCOPIC PROCEDURE TODAY AT THE [facility]:   Refer to the procedure report that was given to you for any specific questions about what was found during the examination.  If the procedure report does not answer your questions, please call your gastroenterologist to clarify.  If you requested that your care partner not be given the details of your procedure findings, then the procedure report has been included in a sealed envelope for you to review at your convenience later.  YOU SHOULD EXPECT: Some feelings of bloating in the abdomen. Passage of more gas than usual.  Walking can help get rid of the air that was put into your GI tract during the procedure and reduce the bloating. If you had a lower endoscopy (such as a colonoscopy or flexible sigmoidoscopy) you may notice spotting of blood in your stool or on the toilet paper. If you underwent a bowel prep for your procedure, you may not have a normal bowel movement for a few days.  Please Note:  You might notice some irritation and congestion in your nose or some drainage.  This is from the oxygen used during your procedure.  There is no need for concern and it should clear up in a day or so.  SYMPTOMS TO REPORT IMMEDIATELY:  Following lower endoscopy (colonoscopy or flexible sigmoidoscopy):  Excessive amounts of blood in the stool  Significant tenderness or worsening of abdominal pains  Swelling of the abdomen that is new, acute  Fever of 100F or higher  Following upper endoscopy (EGD)  Vomiting of blood or coffee ground material  New chest pain or pain under the shoulder blades  Painful or persistently difficult swallowing  New shortness of breath  Fever of 100F or higher  Black, tarry-looking stools  For urgent or  emergent issues, a gastroenterologist can be reached at any hour by calling [phone]. Do not use MyChart messaging for urgent concerns.    DIET:  We do recommend a small meal at first, but then you may proceed to your regular diet.  Drink plenty of fluids but you should avoid alcoholic beverages for 24 hours.  ACTIVITY:  You should plan to take it easy for the rest of today and you should NOT DRIVE or use heavy machinery until tomorrow (because of the sedation medicines used during the test).    FOLLOW UP: Our staff will call the number listed on your records the next business day following your procedure.  We will call around 7:15- 8:00 am to check on you and address any questions or concerns that you may have regarding the information given to you following your procedure. If we do not reach you, we will leave a message.     If any biopsies were taken you will be contacted by phone or by letter within the next 1-3 weeks.  Please call us  at [phone] if you have not heard about the biopsies in 3 weeks.    SIGNATURES/CONFIDENTIALITY: You and/or your care partner have signed paperwork which will be entered into your electronic medical record.  These signatures attest to the fact that that the information above on your After Visit Summary has been reviewed and is understood.  Full responsibility of the confidentiality of this discharge information lies with you and/or your care-partner.

## 2024-06-11 NOTE — Progress Notes (Signed)
 Report given to PACU, vss

## 2024-06-11 NOTE — Progress Notes (Signed)
0800 Robinul 0.1 mg IV given due large amount of secretions upon assessment.  MD made aware, vss 

## 2024-06-12 ENCOUNTER — Telehealth: Payer: Self-pay | Admitting: Lactation Services

## 2024-06-12 NOTE — Telephone Encounter (Signed)
 No answer left voice mail

## 2024-06-14 LAB — SURGICAL PATHOLOGY

## 2024-06-19 ENCOUNTER — Ambulatory Visit: Payer: Self-pay | Admitting: Gastroenterology
